# Patient Record
Sex: Female | Born: 1940
Health system: Southern US, Community
[De-identification: ages and names within clinical notes are randomized; demographics above are authoritative.]

## PROBLEM LIST (undated history)

## (undated) DIAGNOSIS — E785 Hyperlipidemia, unspecified: Secondary | ICD-10-CM

## (undated) DIAGNOSIS — D689 Coagulation defect, unspecified: Secondary | ICD-10-CM

## (undated) DIAGNOSIS — I4819 Other persistent atrial fibrillation: Secondary | ICD-10-CM

## (undated) DIAGNOSIS — Z8619 Personal history of other infectious and parasitic diseases: Secondary | ICD-10-CM

## (undated) DIAGNOSIS — J449 Chronic obstructive pulmonary disease, unspecified: Secondary | ICD-10-CM

## (undated) DIAGNOSIS — T7840XA Allergy, unspecified, initial encounter: Secondary | ICD-10-CM

## (undated) DIAGNOSIS — J45909 Unspecified asthma, uncomplicated: Secondary | ICD-10-CM

## (undated) DIAGNOSIS — Z7901 Long term (current) use of anticoagulants: Secondary | ICD-10-CM

## (undated) DIAGNOSIS — M1990 Unspecified osteoarthritis, unspecified site: Secondary | ICD-10-CM

## (undated) DIAGNOSIS — H269 Unspecified cataract: Secondary | ICD-10-CM

## (undated) DIAGNOSIS — I110 Hypertensive heart disease with heart failure: Secondary | ICD-10-CM

## (undated) DIAGNOSIS — I509 Heart failure, unspecified: Secondary | ICD-10-CM

## (undated) DIAGNOSIS — I10 Essential (primary) hypertension: Secondary | ICD-10-CM

## (undated) HISTORY — DX: Long term (current) use of anticoagulants: Z79.01

## (undated) HISTORY — PX: ABDOMINAL HYSTERECTOMY: SHX81

## (undated) HISTORY — PX: CHOLECYSTECTOMY: SHX55

## (undated) HISTORY — PX: APPENDECTOMY: SHX54

## (undated) HISTORY — DX: Heart failure, unspecified: I50.9

## (undated) HISTORY — PX: COLONOSCOPY: SHX174

## (undated) HISTORY — DX: Hyperlipidemia, unspecified: E78.5

## (undated) HISTORY — DX: Unspecified osteoarthritis, unspecified site: M19.90

## (undated) HISTORY — DX: Essential (primary) hypertension: I10

## (undated) HISTORY — DX: Unspecified asthma, uncomplicated: J45.909

## (undated) HISTORY — DX: Other persistent atrial fibrillation: I48.19

## (undated) HISTORY — DX: Allergy, unspecified, initial encounter: T78.40XA

## (undated) HISTORY — DX: Personal history of other infectious and parasitic diseases: Z86.19

## (undated) HISTORY — DX: Coagulation defect, unspecified: D68.9

## (undated) HISTORY — PX: TONSILLECTOMY: SUR1361

## (undated) HISTORY — DX: Chronic obstructive pulmonary disease, unspecified: J44.9

## (undated) HISTORY — DX: Unspecified cataract: H26.9

## (undated) HISTORY — DX: Hypertensive heart disease with heart failure: I11.0

---

## 2015-03-09 DIAGNOSIS — J209 Acute bronchitis, unspecified: Secondary | ICD-10-CM | POA: Diagnosis not present

## 2015-03-12 ENCOUNTER — Ambulatory Visit: Payer: Self-pay | Admitting: Allergy and Immunology

## 2015-03-12 DIAGNOSIS — J0100 Acute maxillary sinusitis, unspecified: Secondary | ICD-10-CM | POA: Diagnosis not present

## 2015-03-12 DIAGNOSIS — J209 Acute bronchitis, unspecified: Secondary | ICD-10-CM | POA: Diagnosis not present

## 2015-04-14 DIAGNOSIS — R05 Cough: Secondary | ICD-10-CM | POA: Diagnosis not present

## 2015-04-14 DIAGNOSIS — R0602 Shortness of breath: Secondary | ICD-10-CM | POA: Diagnosis not present

## 2015-04-14 DIAGNOSIS — J209 Acute bronchitis, unspecified: Secondary | ICD-10-CM | POA: Diagnosis not present

## 2015-05-05 DIAGNOSIS — J209 Acute bronchitis, unspecified: Secondary | ICD-10-CM | POA: Diagnosis not present

## 2015-05-05 DIAGNOSIS — J4521 Mild intermittent asthma with (acute) exacerbation: Secondary | ICD-10-CM | POA: Diagnosis not present

## 2015-05-14 DIAGNOSIS — R413 Other amnesia: Secondary | ICD-10-CM | POA: Diagnosis not present

## 2015-06-05 DIAGNOSIS — R413 Other amnesia: Secondary | ICD-10-CM | POA: Diagnosis not present

## 2015-06-05 DIAGNOSIS — R42 Dizziness and giddiness: Secondary | ICD-10-CM | POA: Diagnosis not present

## 2015-06-05 DIAGNOSIS — I129 Hypertensive chronic kidney disease with stage 1 through stage 4 chronic kidney disease, or unspecified chronic kidney disease: Secondary | ICD-10-CM | POA: Diagnosis not present

## 2015-06-05 DIAGNOSIS — N183 Chronic kidney disease, stage 3 (moderate): Secondary | ICD-10-CM | POA: Diagnosis not present

## 2015-06-10 DIAGNOSIS — R42 Dizziness and giddiness: Secondary | ICD-10-CM | POA: Diagnosis not present

## 2015-06-10 DIAGNOSIS — R413 Other amnesia: Secondary | ICD-10-CM | POA: Diagnosis not present

## 2015-06-11 DIAGNOSIS — R413 Other amnesia: Secondary | ICD-10-CM | POA: Diagnosis not present

## 2015-06-11 DIAGNOSIS — Z0000 Encounter for general adult medical examination without abnormal findings: Secondary | ICD-10-CM | POA: Diagnosis not present

## 2015-06-11 DIAGNOSIS — Z13820 Encounter for screening for osteoporosis: Secondary | ICD-10-CM | POA: Diagnosis not present

## 2015-06-11 DIAGNOSIS — I129 Hypertensive chronic kidney disease with stage 1 through stage 4 chronic kidney disease, or unspecified chronic kidney disease: Secondary | ICD-10-CM | POA: Diagnosis not present

## 2015-06-11 DIAGNOSIS — N183 Chronic kidney disease, stage 3 (moderate): Secondary | ICD-10-CM | POA: Diagnosis not present

## 2015-06-11 DIAGNOSIS — E559 Vitamin D deficiency, unspecified: Secondary | ICD-10-CM | POA: Diagnosis not present

## 2015-06-11 DIAGNOSIS — Z780 Asymptomatic menopausal state: Secondary | ICD-10-CM | POA: Diagnosis not present

## 2015-06-11 DIAGNOSIS — Z9181 History of falling: Secondary | ICD-10-CM | POA: Diagnosis not present

## 2015-06-11 DIAGNOSIS — Z1231 Encounter for screening mammogram for malignant neoplasm of breast: Secondary | ICD-10-CM | POA: Diagnosis not present

## 2015-06-11 DIAGNOSIS — Z1389 Encounter for screening for other disorder: Secondary | ICD-10-CM | POA: Diagnosis not present

## 2015-06-19 DIAGNOSIS — Z1231 Encounter for screening mammogram for malignant neoplasm of breast: Secondary | ICD-10-CM | POA: Diagnosis not present

## 2015-06-19 DIAGNOSIS — M85851 Other specified disorders of bone density and structure, right thigh: Secondary | ICD-10-CM | POA: Diagnosis not present

## 2015-06-19 DIAGNOSIS — Z780 Asymptomatic menopausal state: Secondary | ICD-10-CM | POA: Diagnosis not present

## 2015-06-19 DIAGNOSIS — M8580 Other specified disorders of bone density and structure, unspecified site: Secondary | ICD-10-CM | POA: Diagnosis not present

## 2015-07-10 DIAGNOSIS — Z6839 Body mass index (BMI) 39.0-39.9, adult: Secondary | ICD-10-CM | POA: Diagnosis not present

## 2015-07-10 DIAGNOSIS — I10 Essential (primary) hypertension: Secondary | ICD-10-CM | POA: Diagnosis not present

## 2015-07-10 DIAGNOSIS — R69 Illness, unspecified: Secondary | ICD-10-CM | POA: Diagnosis not present

## 2015-07-10 DIAGNOSIS — I5030 Unspecified diastolic (congestive) heart failure: Secondary | ICD-10-CM | POA: Diagnosis not present

## 2015-07-10 DIAGNOSIS — R42 Dizziness and giddiness: Secondary | ICD-10-CM | POA: Diagnosis not present

## 2015-07-10 DIAGNOSIS — R413 Other amnesia: Secondary | ICD-10-CM | POA: Diagnosis not present

## 2015-07-10 DIAGNOSIS — E7800 Pure hypercholesterolemia, unspecified: Secondary | ICD-10-CM | POA: Diagnosis not present

## 2015-07-10 DIAGNOSIS — E669 Obesity, unspecified: Secondary | ICD-10-CM | POA: Diagnosis not present

## 2015-07-10 DIAGNOSIS — I4892 Unspecified atrial flutter: Secondary | ICD-10-CM | POA: Diagnosis not present

## 2015-07-10 DIAGNOSIS — I481 Persistent atrial fibrillation: Secondary | ICD-10-CM | POA: Diagnosis not present

## 2015-07-10 DIAGNOSIS — I200 Unstable angina: Secondary | ICD-10-CM | POA: Diagnosis not present

## 2015-07-10 DIAGNOSIS — I161 Hypertensive emergency: Secondary | ICD-10-CM | POA: Diagnosis not present

## 2015-07-10 DIAGNOSIS — E785 Hyperlipidemia, unspecified: Secondary | ICD-10-CM | POA: Diagnosis not present

## 2015-07-10 DIAGNOSIS — I110 Hypertensive heart disease with heart failure: Secondary | ICD-10-CM | POA: Diagnosis not present

## 2015-07-10 DIAGNOSIS — R079 Chest pain, unspecified: Secondary | ICD-10-CM | POA: Diagnosis not present

## 2015-07-10 DIAGNOSIS — J45909 Unspecified asthma, uncomplicated: Secondary | ICD-10-CM | POA: Diagnosis not present

## 2015-07-10 DIAGNOSIS — I4891 Unspecified atrial fibrillation: Secondary | ICD-10-CM | POA: Diagnosis not present

## 2015-07-11 DIAGNOSIS — R079 Chest pain, unspecified: Secondary | ICD-10-CM | POA: Diagnosis not present

## 2015-07-11 DIAGNOSIS — I481 Persistent atrial fibrillation: Secondary | ICD-10-CM | POA: Diagnosis not present

## 2015-07-11 DIAGNOSIS — I4891 Unspecified atrial fibrillation: Secondary | ICD-10-CM | POA: Diagnosis not present

## 2015-07-11 DIAGNOSIS — I110 Hypertensive heart disease with heart failure: Secondary | ICD-10-CM | POA: Diagnosis not present

## 2015-07-11 DIAGNOSIS — E785 Hyperlipidemia, unspecified: Secondary | ICD-10-CM | POA: Diagnosis not present

## 2015-07-12 DIAGNOSIS — E785 Hyperlipidemia, unspecified: Secondary | ICD-10-CM | POA: Diagnosis not present

## 2015-07-12 DIAGNOSIS — I481 Persistent atrial fibrillation: Secondary | ICD-10-CM | POA: Diagnosis not present

## 2015-07-12 DIAGNOSIS — I110 Hypertensive heart disease with heart failure: Secondary | ICD-10-CM | POA: Diagnosis not present

## 2015-07-15 DIAGNOSIS — I129 Hypertensive chronic kidney disease with stage 1 through stage 4 chronic kidney disease, or unspecified chronic kidney disease: Secondary | ICD-10-CM | POA: Diagnosis not present

## 2015-07-15 DIAGNOSIS — I4892 Unspecified atrial flutter: Secondary | ICD-10-CM | POA: Diagnosis not present

## 2015-07-15 DIAGNOSIS — I4891 Unspecified atrial fibrillation: Secondary | ICD-10-CM | POA: Diagnosis not present

## 2015-07-15 DIAGNOSIS — N183 Chronic kidney disease, stage 3 (moderate): Secondary | ICD-10-CM | POA: Diagnosis not present

## 2015-07-15 DIAGNOSIS — Z9189 Other specified personal risk factors, not elsewhere classified: Secondary | ICD-10-CM | POA: Diagnosis not present

## 2015-08-14 DIAGNOSIS — N183 Chronic kidney disease, stage 3 (moderate): Secondary | ICD-10-CM | POA: Diagnosis not present

## 2015-08-14 DIAGNOSIS — I4892 Unspecified atrial flutter: Secondary | ICD-10-CM | POA: Diagnosis not present

## 2015-08-14 DIAGNOSIS — I129 Hypertensive chronic kidney disease with stage 1 through stage 4 chronic kidney disease, or unspecified chronic kidney disease: Secondary | ICD-10-CM | POA: Diagnosis not present

## 2015-08-14 DIAGNOSIS — R413 Other amnesia: Secondary | ICD-10-CM | POA: Diagnosis not present

## 2015-08-14 DIAGNOSIS — I4891 Unspecified atrial fibrillation: Secondary | ICD-10-CM | POA: Diagnosis not present

## 2015-09-09 DIAGNOSIS — I110 Hypertensive heart disease with heart failure: Secondary | ICD-10-CM | POA: Insufficient documentation

## 2015-09-09 DIAGNOSIS — I4819 Other persistent atrial fibrillation: Secondary | ICD-10-CM

## 2015-09-09 DIAGNOSIS — Z7901 Long term (current) use of anticoagulants: Secondary | ICD-10-CM | POA: Insufficient documentation

## 2015-09-09 HISTORY — DX: Other persistent atrial fibrillation: I48.19

## 2015-09-09 HISTORY — DX: Hypertensive heart disease with heart failure: I11.0

## 2015-09-09 HISTORY — DX: Long term (current) use of anticoagulants: Z79.01

## 2015-09-10 DIAGNOSIS — I110 Hypertensive heart disease with heart failure: Secondary | ICD-10-CM | POA: Diagnosis not present

## 2015-09-10 DIAGNOSIS — Z7901 Long term (current) use of anticoagulants: Secondary | ICD-10-CM | POA: Diagnosis not present

## 2015-09-10 DIAGNOSIS — I481 Persistent atrial fibrillation: Secondary | ICD-10-CM | POA: Diagnosis not present

## 2015-10-13 DIAGNOSIS — R1033 Periumbilical pain: Secondary | ICD-10-CM | POA: Diagnosis not present

## 2015-10-13 DIAGNOSIS — R109 Unspecified abdominal pain: Secondary | ICD-10-CM | POA: Diagnosis not present

## 2015-10-13 DIAGNOSIS — R05 Cough: Secondary | ICD-10-CM | POA: Diagnosis not present

## 2015-10-13 DIAGNOSIS — R1013 Epigastric pain: Secondary | ICD-10-CM | POA: Diagnosis not present

## 2015-10-13 DIAGNOSIS — R0602 Shortness of breath: Secondary | ICD-10-CM | POA: Diagnosis not present

## 2015-10-16 DIAGNOSIS — R42 Dizziness and giddiness: Secondary | ICD-10-CM | POA: Diagnosis not present

## 2015-10-16 DIAGNOSIS — J449 Chronic obstructive pulmonary disease, unspecified: Secondary | ICD-10-CM | POA: Diagnosis not present

## 2015-10-16 DIAGNOSIS — I130 Hypertensive heart and chronic kidney disease with heart failure and stage 1 through stage 4 chronic kidney disease, or unspecified chronic kidney disease: Secondary | ICD-10-CM | POA: Diagnosis not present

## 2015-10-16 DIAGNOSIS — I5030 Unspecified diastolic (congestive) heart failure: Secondary | ICD-10-CM | POA: Diagnosis not present

## 2015-10-16 DIAGNOSIS — N183 Chronic kidney disease, stage 3 (moderate): Secondary | ICD-10-CM | POA: Diagnosis not present

## 2015-10-16 DIAGNOSIS — Z79899 Other long term (current) drug therapy: Secondary | ICD-10-CM | POA: Diagnosis not present

## 2015-10-16 DIAGNOSIS — R109 Unspecified abdominal pain: Secondary | ICD-10-CM | POA: Diagnosis not present

## 2015-11-17 DIAGNOSIS — Z23 Encounter for immunization: Secondary | ICD-10-CM | POA: Diagnosis not present

## 2015-12-10 DIAGNOSIS — I481 Persistent atrial fibrillation: Secondary | ICD-10-CM | POA: Diagnosis not present

## 2015-12-10 DIAGNOSIS — Z7901 Long term (current) use of anticoagulants: Secondary | ICD-10-CM | POA: Diagnosis not present

## 2015-12-10 DIAGNOSIS — I110 Hypertensive heart disease with heart failure: Secondary | ICD-10-CM | POA: Diagnosis not present

## 2015-12-31 DIAGNOSIS — Z6836 Body mass index (BMI) 36.0-36.9, adult: Secondary | ICD-10-CM | POA: Diagnosis not present

## 2015-12-31 DIAGNOSIS — E559 Vitamin D deficiency, unspecified: Secondary | ICD-10-CM | POA: Diagnosis not present

## 2015-12-31 DIAGNOSIS — Z0000 Encounter for general adult medical examination without abnormal findings: Secondary | ICD-10-CM | POA: Diagnosis not present

## 2015-12-31 DIAGNOSIS — Z9181 History of falling: Secondary | ICD-10-CM | POA: Diagnosis not present

## 2015-12-31 DIAGNOSIS — Z136 Encounter for screening for cardiovascular disorders: Secondary | ICD-10-CM | POA: Diagnosis not present

## 2015-12-31 DIAGNOSIS — I130 Hypertensive heart and chronic kidney disease with heart failure and stage 1 through stage 4 chronic kidney disease, or unspecified chronic kidney disease: Secondary | ICD-10-CM | POA: Diagnosis not present

## 2015-12-31 DIAGNOSIS — E7800 Pure hypercholesterolemia, unspecified: Secondary | ICD-10-CM | POA: Diagnosis not present

## 2016-01-26 DIAGNOSIS — Z79899 Other long term (current) drug therapy: Secondary | ICD-10-CM | POA: Diagnosis not present

## 2016-01-26 DIAGNOSIS — N183 Chronic kidney disease, stage 3 (moderate): Secondary | ICD-10-CM | POA: Diagnosis not present

## 2016-01-26 DIAGNOSIS — I4892 Unspecified atrial flutter: Secondary | ICD-10-CM | POA: Diagnosis not present

## 2016-01-26 DIAGNOSIS — I5030 Unspecified diastolic (congestive) heart failure: Secondary | ICD-10-CM | POA: Diagnosis not present

## 2016-01-26 DIAGNOSIS — I4891 Unspecified atrial fibrillation: Secondary | ICD-10-CM | POA: Diagnosis not present

## 2016-01-26 DIAGNOSIS — R413 Other amnesia: Secondary | ICD-10-CM | POA: Diagnosis not present

## 2016-01-26 DIAGNOSIS — I130 Hypertensive heart and chronic kidney disease with heart failure and stage 1 through stage 4 chronic kidney disease, or unspecified chronic kidney disease: Secondary | ICD-10-CM | POA: Diagnosis not present

## 2016-01-28 DIAGNOSIS — Z1211 Encounter for screening for malignant neoplasm of colon: Secondary | ICD-10-CM | POA: Diagnosis not present

## 2016-05-27 DIAGNOSIS — H524 Presbyopia: Secondary | ICD-10-CM | POA: Diagnosis not present

## 2016-05-27 DIAGNOSIS — Z0100 Encounter for examination of eyes and vision without abnormal findings: Secondary | ICD-10-CM | POA: Diagnosis not present

## 2016-06-16 DIAGNOSIS — E7800 Pure hypercholesterolemia, unspecified: Secondary | ICD-10-CM | POA: Diagnosis not present

## 2016-06-16 DIAGNOSIS — E669 Obesity, unspecified: Secondary | ICD-10-CM | POA: Diagnosis not present

## 2016-06-16 DIAGNOSIS — Z0000 Encounter for general adult medical examination without abnormal findings: Secondary | ICD-10-CM | POA: Diagnosis not present

## 2016-06-16 DIAGNOSIS — R8290 Unspecified abnormal findings in urine: Secondary | ICD-10-CM | POA: Diagnosis not present

## 2016-06-16 DIAGNOSIS — I5030 Unspecified diastolic (congestive) heart failure: Secondary | ICD-10-CM | POA: Diagnosis not present

## 2016-06-16 DIAGNOSIS — E559 Vitamin D deficiency, unspecified: Secondary | ICD-10-CM | POA: Diagnosis not present

## 2016-06-16 DIAGNOSIS — Z131 Encounter for screening for diabetes mellitus: Secondary | ICD-10-CM | POA: Diagnosis not present

## 2016-06-16 DIAGNOSIS — Z9181 History of falling: Secondary | ICD-10-CM | POA: Diagnosis not present

## 2016-06-16 DIAGNOSIS — N183 Chronic kidney disease, stage 3 (moderate): Secondary | ICD-10-CM | POA: Diagnosis not present

## 2016-06-16 DIAGNOSIS — Z1211 Encounter for screening for malignant neoplasm of colon: Secondary | ICD-10-CM | POA: Diagnosis not present

## 2016-06-16 DIAGNOSIS — I130 Hypertensive heart and chronic kidney disease with heart failure and stage 1 through stage 4 chronic kidney disease, or unspecified chronic kidney disease: Secondary | ICD-10-CM | POA: Diagnosis not present

## 2016-06-16 DIAGNOSIS — Z1389 Encounter for screening for other disorder: Secondary | ICD-10-CM | POA: Diagnosis not present

## 2016-06-20 DIAGNOSIS — Z1231 Encounter for screening mammogram for malignant neoplasm of breast: Secondary | ICD-10-CM | POA: Diagnosis not present

## 2016-06-23 DIAGNOSIS — I481 Persistent atrial fibrillation: Secondary | ICD-10-CM | POA: Diagnosis not present

## 2016-06-23 DIAGNOSIS — R55 Syncope and collapse: Secondary | ICD-10-CM | POA: Diagnosis not present

## 2016-06-23 DIAGNOSIS — Z87898 Personal history of other specified conditions: Secondary | ICD-10-CM | POA: Diagnosis not present

## 2016-06-23 DIAGNOSIS — J449 Chronic obstructive pulmonary disease, unspecified: Secondary | ICD-10-CM | POA: Diagnosis not present

## 2016-06-23 DIAGNOSIS — R413 Other amnesia: Secondary | ICD-10-CM | POA: Diagnosis not present

## 2016-06-23 DIAGNOSIS — E7800 Pure hypercholesterolemia, unspecified: Secondary | ICD-10-CM | POA: Diagnosis not present

## 2016-06-23 DIAGNOSIS — Z7901 Long term (current) use of anticoagulants: Secondary | ICD-10-CM | POA: Diagnosis not present

## 2016-06-23 DIAGNOSIS — N183 Chronic kidney disease, stage 3 (moderate): Secondary | ICD-10-CM | POA: Diagnosis not present

## 2016-06-23 DIAGNOSIS — I4891 Unspecified atrial fibrillation: Secondary | ICD-10-CM | POA: Diagnosis not present

## 2016-06-23 DIAGNOSIS — E559 Vitamin D deficiency, unspecified: Secondary | ICD-10-CM | POA: Diagnosis not present

## 2016-06-23 DIAGNOSIS — I5030 Unspecified diastolic (congestive) heart failure: Secondary | ICD-10-CM | POA: Diagnosis not present

## 2016-06-23 DIAGNOSIS — I4892 Unspecified atrial flutter: Secondary | ICD-10-CM | POA: Diagnosis not present

## 2016-06-23 DIAGNOSIS — I130 Hypertensive heart and chronic kidney disease with heart failure and stage 1 through stage 4 chronic kidney disease, or unspecified chronic kidney disease: Secondary | ICD-10-CM | POA: Diagnosis not present

## 2016-06-23 DIAGNOSIS — I110 Hypertensive heart disease with heart failure: Secondary | ICD-10-CM | POA: Diagnosis not present

## 2016-06-28 DIAGNOSIS — I480 Paroxysmal atrial fibrillation: Secondary | ICD-10-CM | POA: Diagnosis not present

## 2016-06-30 DIAGNOSIS — N390 Urinary tract infection, site not specified: Secondary | ICD-10-CM | POA: Diagnosis not present

## 2016-06-30 DIAGNOSIS — I481 Persistent atrial fibrillation: Secondary | ICD-10-CM | POA: Diagnosis not present

## 2016-06-30 DIAGNOSIS — R55 Syncope and collapse: Secondary | ICD-10-CM | POA: Diagnosis not present

## 2016-07-01 DIAGNOSIS — I481 Persistent atrial fibrillation: Secondary | ICD-10-CM | POA: Diagnosis not present

## 2016-07-21 DIAGNOSIS — I481 Persistent atrial fibrillation: Secondary | ICD-10-CM | POA: Diagnosis not present

## 2016-07-21 DIAGNOSIS — I110 Hypertensive heart disease with heart failure: Secondary | ICD-10-CM | POA: Diagnosis not present

## 2016-07-21 DIAGNOSIS — Z7901 Long term (current) use of anticoagulants: Secondary | ICD-10-CM | POA: Diagnosis not present

## 2016-07-21 DIAGNOSIS — I480 Paroxysmal atrial fibrillation: Secondary | ICD-10-CM | POA: Diagnosis not present

## 2016-11-03 DIAGNOSIS — E785 Hyperlipidemia, unspecified: Secondary | ICD-10-CM

## 2016-11-03 HISTORY — DX: Hyperlipidemia, unspecified: E78.5

## 2016-11-16 DIAGNOSIS — I5032 Chronic diastolic (congestive) heart failure: Secondary | ICD-10-CM | POA: Insufficient documentation

## 2016-11-16 NOTE — Progress Notes (Signed)
Cardiology Office Note:    Date:  11/18/2016   ID:  Robin Salazar, DOB 27-Mar-1940, MRN [id number]  PCP:  Doreene Eland, MD  Cardiologist:  Norman Herrlich, MD    Referring MD: No ref. provider found    ASSESSMENT:    1. Persistent atrial fibrillation (HCC)   2. Chronic diastolic heart failure (HCC)   3. Hypertensive heart disease with heart failure (HCC)   4. Chronic anticoagulation   5. Chest pain in adult    PLAN:    In order of problems listed above:  1. Stable rate controlled continue beta blocker and current anticoagulant 2. Stable compensated continue her current diuretic sodium restriction 3. Stable continue diuretic beta blocker and ARB 4. Stable continue her anticoagulant 5. At risk for CAD undergo myocardial perfusion imaging.   Next appointment: 6 months   Medication Adjustments/Labs and Tests Ordered: Current medicines are reviewed at length with the patient today.  Concerns regarding medicines are outlined above.  Orders Placed This Encounter  Procedures  . MYOCARDIAL PERFUSION IMAGING  . EKG 12-Lead  . Electrocardiogram report   No orders of the defined types were placed in this encounter.   Chief Complaint  Patient presents with  . Follow-up    History of Present Illness:    Robin Salazar is a 76 y.o. female with a hx of Diastolic CHF, persitent Atrial Fibrillation, HTN, Hyperlipidemia  last seen in May 2018.she hand carries an EKG performed at another practice in June showing typical atrial flutter and rapid rate at which time her beta blocker was increased. Since then she has felt well and is had no shortness of breath palpitation syncope TIA or bleeding complication of her anticoagulant. She points to her right costochondral junction and tells me she gets intermittent brief sharp nonexertional chest pain which is reproducible by physical examination. It is been greater than 3-5 years since an ischemic evaluation and she'll undergo myocardial  perfusion study. Compliance with diet, lifestyle and medications: yes Past Medical History:  Diagnosis Date  . Chronic anticoagulation 09/09/2015  . Hyperlipidemia 11/03/2016  . Hypertensive heart disease with heart failure (HCC) 09/09/2015  . Persistent atrial fibrillation (HCC) 09/09/2015    Past Surgical History:  Procedure Laterality Date  . ABDOMINAL HYSTERECTOMY    . APPENDECTOMY    . CHOLECYSTECTOMY    . TONSILLECTOMY      Current Medications: Current Meds  Medication Sig  . apixaban (ELIQUIS) 5 MG TABS tablet Take 5 mg by mouth 2 (two) times daily.  Marland Kitchen CALCIUM PO Take 1 tablet by mouth daily.  . DOCOSAHEXAENOIC ACID PO Take 2 g by mouth daily.  Marland Kitchen donepezil (ARICEPT) 10 MG tablet Take 10 mg by mouth daily.  . Fluticasone-Salmeterol (ADVAIR DISKUS) 250-50 MCG/DOSE AEPB Inhale 1 puff into the lungs 2 (two) times daily.  . furosemide (LASIX) 20 MG tablet Take 40 mg by mouth daily.  Marland Kitchen KLOR-CON M20 20 MEQ tablet Take 20 mEq by mouth 2 (two) times daily.  . metoprolol tartrate (LOPRESSOR) 25 MG tablet Take 25 mg by mouth. 2 tablets in the Am and 1 tablet in the pm  . Multiple Vitamin (MULTI-VITAMINS) TABS Take 1 tablet by mouth daily.  . pantoprazole (PROTONIX) 40 MG tablet Take 40 mg by mouth daily.  . simvastatin (ZOCOR) 40 MG tablet Take 40 mg by mouth daily.  Marland Kitchen telmisartan (MICARDIS) 80 MG tablet Take 80 mg by mouth daily.  . Vitamin D, Ergocalciferol, (DRISDOL) 50000 units CAPS capsule Take 1  capsule by mouth once a week.     Allergies:   Patient has no known allergies.   Social History   Social History  . Marital status: Married    Spouse name: N/A  . Number of children: N/A  . Years of education: N/A   Social History Main Topics  . Smoking status: Never Smoker  . Smokeless tobacco: Never Used  . Alcohol use No  . Drug use: No  . Sexual activity: Not Asked   Other Topics Concern  . None   Social History Narrative  . None     Family History: The patient's  family history includes Diabetes in her father; Heart disease in her father and mother. ROS:   Please see the history of present illness.    All other systems reviewed and are negative.  EKGs/Labs/Other Studies Reviewed:    The following studies were reviewed today:  EKG:  EKG ordered today.  The ekg ordered today demonstrates AF and a controlled rate. Her EKG in June showed typical flutter with a rapid rate.  Recent Labs: requested from her PCP   Physical Exam:    VS:  BP 114/70   Pulse 63   Resp (!) 94   Ht  (1.575 m)   Wt 189 lb (85.7 kg)   BMI 34.57 kg/m     Wt Readings from Last 3 Encounters:  11/18/16 189 lb (85.7 kg)     GEN:  Well nourished, well developed in no acute distress HEENT: Normal NECK: No JVD; No carotid bruits LYMPHATICS: No lymphadenopathy CARDIAC: irregular irregular variable first heart sound no murmurs, rubs, gallops there is point tenderness along the right and left CCJ consistent with costochondral pain RESPIRATORY:  Clear to auscultation without rales, wheezing or rhonchi  ABDOMEN: Soft, non-tender, non-distended MUSCULOSKELETAL:  No edema; No deformity  SKIN: Warm and dry NEUROLOGIC:  Alert and oriented x 3 PSYCHIATRIC:  Normal affect    Signed, Norman Herrlich, MD  11/18/2016 11:52 AM    [facility]

## 2016-11-18 ENCOUNTER — Ambulatory Visit (INDEPENDENT_AMBULATORY_CARE_PROVIDER_SITE_OTHER): Payer: Medicare HMO | Admitting: Cardiology

## 2016-11-18 ENCOUNTER — Encounter: Payer: Self-pay | Admitting: Cardiology

## 2016-11-18 VITALS — BP 114/70 | HR 63 | Resp 94 | Ht 62.0 in | Wt 189.0 lb

## 2016-11-18 DIAGNOSIS — I110 Hypertensive heart disease with heart failure: Secondary | ICD-10-CM

## 2016-11-18 DIAGNOSIS — I5032 Chronic diastolic (congestive) heart failure: Secondary | ICD-10-CM

## 2016-11-18 DIAGNOSIS — R079 Chest pain, unspecified: Secondary | ICD-10-CM

## 2016-11-18 DIAGNOSIS — Z7901 Long term (current) use of anticoagulants: Secondary | ICD-10-CM

## 2016-11-18 DIAGNOSIS — I481 Persistent atrial fibrillation: Secondary | ICD-10-CM

## 2016-11-18 DIAGNOSIS — I4819 Other persistent atrial fibrillation: Secondary | ICD-10-CM

## 2016-11-18 NOTE — Patient Instructions (Signed)
Medication Instructions:  Your physician recommends that you continue on your current medications as directed. Please refer to the Current Medication list given to you today.   Labwork: NONE   Testing/Procedures: You had an EKG today  Your physician has requested that you have a lexiscan myoview. For further information please visit [email or web address].org. Please follow instruction sheet, as given.    Follow-Up: Your physician wants you to follow-up in: 6 months.  You will receive a reminder letter in the mail two months in advance. If you don't receive a letter, please call our office to schedule the follow-up appointment.   Any Other Special Instructions Will Be Listed Below (If Applicable).     If you need a refill on your cardiac medications before your next appointment, please call your pharmacy.   

## 2016-11-25 ENCOUNTER — Ambulatory Visit (HOSPITAL_COMMUNITY): Payer: Medicare HMO | Attending: Internal Medicine

## 2016-11-25 DIAGNOSIS — I481 Persistent atrial fibrillation: Secondary | ICD-10-CM | POA: Diagnosis not present

## 2016-11-25 DIAGNOSIS — I4819 Other persistent atrial fibrillation: Secondary | ICD-10-CM

## 2016-11-25 LAB — MYOCARDIAL PERFUSION IMAGING
CHL CUP NUCLEAR SSS: 3
CHL CUP STRESS STAGE 1 HR: 63 {beats}/min
CHL CUP STRESS STAGE 1 SBP: 139 mmHg
CHL CUP STRESS STAGE 1 SPEED: 0 mph
CHL CUP STRESS STAGE 2 GRADE: 0 %
CHL CUP STRESS STAGE 2 SPEED: 0 mph
CHL CUP STRESS STAGE 3 DBP: 87 mmHg
CHL CUP STRESS STAGE 3 HR: 105 {beats}/min
CHL CUP STRESS STAGE 3 SBP: 140 mmHg
CHL CUP STRESS STAGE 3 SPEED: 0 mph
CHL CUP STRESS STAGE 4 DBP: 79 mmHg
CHL CUP STRESS STAGE 4 HR: 84 {beats}/min
CSEPPBP: 140 mmHg
CSEPPHR: 105 {beats}/min
CSEPPMHR: 72 %
Estimated workload: 1 METS
RATE: 0.37
Rest HR: 70 {beats}/min
SDS: 2
SRS: 1
Stage 1 DBP: 88 mmHg
Stage 1 Grade: 0 %
Stage 2 HR: 62 {beats}/min
Stage 3 Grade: 0 %
Stage 4 Grade: 0 %
Stage 4 SBP: 119 mmHg
Stage 4 Speed: 0 mph
TID: 0.98

## 2016-11-25 MED ORDER — TECHNETIUM TC 99M TETROFOSMIN IV KIT
31.6000 | PACK | Freq: Once | INTRAVENOUS | Status: AC | PRN
  Administered 2016-11-25: 31.6 via INTRAVENOUS
  Filled 2016-11-25: qty 32

## 2016-11-25 MED ORDER — TECHNETIUM TC 99M TETROFOSMIN IV KIT
10.3000 | PACK | Freq: Once | INTRAVENOUS | Status: AC | PRN
  Administered 2016-11-25: 10.3 via INTRAVENOUS
  Filled 2016-11-25: qty 11

## 2016-11-25 MED ORDER — REGADENOSON 0.4 MG/5ML IV SOLN
0.4000 mg | Freq: Once | INTRAVENOUS | Status: AC
  Administered 2016-11-25: 0.4 mg via INTRAVENOUS

## 2016-12-05 DIAGNOSIS — Z23 Encounter for immunization: Secondary | ICD-10-CM | POA: Diagnosis not present

## 2017-01-12 DIAGNOSIS — E669 Obesity, unspecified: Secondary | ICD-10-CM | POA: Diagnosis not present

## 2017-01-12 DIAGNOSIS — Z79899 Other long term (current) drug therapy: Secondary | ICD-10-CM | POA: Diagnosis not present

## 2017-01-12 DIAGNOSIS — Z1389 Encounter for screening for other disorder: Secondary | ICD-10-CM | POA: Diagnosis not present

## 2017-01-12 DIAGNOSIS — E785 Hyperlipidemia, unspecified: Secondary | ICD-10-CM | POA: Diagnosis not present

## 2017-01-12 DIAGNOSIS — Z6837 Body mass index (BMI) 37.0-37.9, adult: Secondary | ICD-10-CM | POA: Diagnosis not present

## 2017-01-12 DIAGNOSIS — Z0000 Encounter for general adult medical examination without abnormal findings: Secondary | ICD-10-CM | POA: Diagnosis not present

## 2017-01-12 DIAGNOSIS — Z136 Encounter for screening for cardiovascular disorders: Secondary | ICD-10-CM | POA: Diagnosis not present

## 2017-01-12 DIAGNOSIS — I130 Hypertensive heart and chronic kidney disease with heart failure and stage 1 through stage 4 chronic kidney disease, or unspecified chronic kidney disease: Secondary | ICD-10-CM | POA: Diagnosis not present

## 2017-02-17 DIAGNOSIS — N183 Chronic kidney disease, stage 3 (moderate): Secondary | ICD-10-CM | POA: Diagnosis not present

## 2017-02-17 DIAGNOSIS — I4892 Unspecified atrial flutter: Secondary | ICD-10-CM | POA: Diagnosis not present

## 2017-02-17 DIAGNOSIS — E7800 Pure hypercholesterolemia, unspecified: Secondary | ICD-10-CM | POA: Diagnosis not present

## 2017-02-17 DIAGNOSIS — I5030 Unspecified diastolic (congestive) heart failure: Secondary | ICD-10-CM | POA: Diagnosis not present

## 2017-02-17 DIAGNOSIS — I130 Hypertensive heart and chronic kidney disease with heart failure and stage 1 through stage 4 chronic kidney disease, or unspecified chronic kidney disease: Secondary | ICD-10-CM | POA: Diagnosis not present

## 2017-02-17 DIAGNOSIS — E559 Vitamin D deficiency, unspecified: Secondary | ICD-10-CM | POA: Diagnosis not present

## 2017-02-17 DIAGNOSIS — I4891 Unspecified atrial fibrillation: Secondary | ICD-10-CM | POA: Diagnosis not present

## 2017-02-17 DIAGNOSIS — J449 Chronic obstructive pulmonary disease, unspecified: Secondary | ICD-10-CM | POA: Diagnosis not present

## 2017-03-23 DIAGNOSIS — S63502A Unspecified sprain of left wrist, initial encounter: Secondary | ICD-10-CM | POA: Diagnosis not present

## 2017-03-23 DIAGNOSIS — S8002XA Contusion of left knee, initial encounter: Secondary | ICD-10-CM | POA: Diagnosis not present

## 2017-03-31 DIAGNOSIS — J988 Other specified respiratory disorders: Secondary | ICD-10-CM | POA: Diagnosis not present

## 2017-03-31 DIAGNOSIS — J22 Unspecified acute lower respiratory infection: Secondary | ICD-10-CM | POA: Diagnosis not present

## 2017-03-31 DIAGNOSIS — R05 Cough: Secondary | ICD-10-CM | POA: Diagnosis not present

## 2017-03-31 DIAGNOSIS — I130 Hypertensive heart and chronic kidney disease with heart failure and stage 1 through stage 4 chronic kidney disease, or unspecified chronic kidney disease: Secondary | ICD-10-CM | POA: Diagnosis not present

## 2017-05-19 ENCOUNTER — Ambulatory Visit (INDEPENDENT_AMBULATORY_CARE_PROVIDER_SITE_OTHER): Payer: Medicare HMO | Admitting: Cardiology

## 2017-05-19 ENCOUNTER — Encounter: Payer: Self-pay | Admitting: Cardiology

## 2017-05-19 VITALS — BP 134/80 | HR 78 | Ht 62.0 in | Wt 197.4 lb

## 2017-05-19 DIAGNOSIS — E785 Hyperlipidemia, unspecified: Secondary | ICD-10-CM

## 2017-05-19 DIAGNOSIS — Z7901 Long term (current) use of anticoagulants: Secondary | ICD-10-CM

## 2017-05-19 DIAGNOSIS — I481 Persistent atrial fibrillation: Secondary | ICD-10-CM | POA: Diagnosis not present

## 2017-05-19 DIAGNOSIS — I5032 Chronic diastolic (congestive) heart failure: Secondary | ICD-10-CM

## 2017-05-19 DIAGNOSIS — I4819 Other persistent atrial fibrillation: Secondary | ICD-10-CM

## 2017-05-19 DIAGNOSIS — I110 Hypertensive heart disease with heart failure: Secondary | ICD-10-CM

## 2017-05-19 NOTE — Patient Instructions (Signed)

## 2017-05-19 NOTE — Progress Notes (Signed)
Cardiology Office Note:    Date:  05/19/2017   ID:  Robin Salazar, DOB [birth year], MRN [id number]  PCP:  Paulina Fusi, MD  Cardiologist:  Norman Herrlich, MD    Referring MD: Doreene Eland, MD please do labs at her office visit including CMP lipid profile BNP and a CBC with anticoagulant   ASSESSMENT:    1. Chronic diastolic heart failure (HCC)   2. Hypertensive heart disease with heart failure (HCC)   3. Persistent atrial fibrillation (HCC)   4. Chronic anticoagulation   5. Hyperlipidemia, unspecified hyperlipidemia type    PLAN:    In order of problems listed above: Stable 1. Stable continue current diuretic sodium restriction home self management assisted by her daughter.  She will have labs at her PCPs office for renal function loss also asked for a BnP level. 2. Stable blood pressure at target continue current treatment ARB beta-blocker 3. Stable rate controlled continue low-dose beta-blocker and current anticoagulant she has had no bleeding complication 4. Stable continue her anticoagulant, she does not require dose adjustment with Eliquis.  For renal function. 5. Stable continue statin check CMP for safety lipid profile for efficacy.   Next appointment: 6 months   Medication Adjustments/Labs and Tests Ordered: Current medicines are reviewed at length with the patient today.  Concerns regarding medicines are outlined above.  No orders of the defined types were placed in this encounter.  No orders of the defined types were placed in this encounter.   Chief Complaint  Patient presents with  . Follow-up    6 month flup appt   . Congestive Heart Failure  . Atrial Fibrillation  . Hypertension    History of Present Illness:    Robin Salazar is a 77 y.o. female with a hx of Diastolic CHF, persitent Atrial Fibrillation/atypical atrial flutter, HTN, Hyperlipidemia last seen 11/18/17.  ASSESSMENT:    11/18/17   1. Persistent atrial fibrillation (HCC)   2.  Chronic diastolic heart failure (HCC)   3. Hypertensive heart disease with heart failure (HCC)   4. Chronic anticoagulation   5. Chest pain in adult    PLAN:    In order of problems listed above:  1. Stable rate controlled continue beta blocker and current anticoagulant 2. Stable compensated continue her current diuretic sodium restriction 3. Stable continue diuretic beta blocker and ARB 4. Stable continue her anticoagulant 5. At risk for CAD undergo myocardial perfusion imaging.    Compliance with diet, lifestyle and medications: Yes but she is not weighing herself and recording her weight. She is supervised by her daughter overall is doing well but still had salt to her diet at times and is not recording her weight but is stable at home.  She is using a pillbox and her daughter counts medications.  She is pleased with the quality of her life lives independently has had no chest pain shortness of breath orthopnea palpitations edema syncope or TIA she is reassured with a normal myocardial perfusion study Past Medical History:  Diagnosis Date  . Chronic anticoagulation 09/09/2015  . Hyperlipidemia 11/03/2016  . Hypertensive heart disease with heart failure (HCC) 09/09/2015  . Persistent atrial fibrillation (HCC) 09/09/2015    Past Surgical History:  Procedure Laterality Date  . ABDOMINAL HYSTERECTOMY    . APPENDECTOMY    . CHOLECYSTECTOMY    . TONSILLECTOMY      Current Medications: Current Meds  Medication Sig  . ANORO ELLIPTA 62.5-25 MCG/INH AEPB TAKE 1 PUFF BY MOUTH  EVERY DAY  . apixaban (ELIQUIS) 5 MG TABS tablet Take 5 mg by mouth 2 (two) times daily.  Marland Kitchen CALCIUM PO Take 1 tablet by mouth daily.  . Chlorpheniramine Maleate (ALLERGY RELIEF PO) Take 1 tablet by mouth daily.  . DOCOSAHEXAENOIC ACID PO Take 2 g by mouth daily.  Marland Kitchen donepezil (ARICEPT) 10 MG tablet Take 10 mg by mouth daily.  . furosemide (LASIX) 20 MG tablet Take 40 mg by mouth daily.  Marland Kitchen KLOR-CON M20 20 MEQ  tablet Take 20 mEq by mouth 2 (two) times daily.  . metoprolol tartrate (LOPRESSOR) 25 MG tablet Take 25 mg by mouth. 2 tablets in the Am and 1 tablet in the pm  . Multiple Vitamin (MULTI-VITAMINS) TABS Take 1 tablet by mouth daily.  . pantoprazole (PROTONIX) 40 MG tablet Take 40 mg by mouth daily.  . simvastatin (ZOCOR) 40 MG tablet Take 40 mg by mouth daily.  Marland Kitchen telmisartan (MICARDIS) 80 MG tablet Take 80 mg by mouth daily.  . Vitamin D, Ergocalciferol, (DRISDOL) 50000 units CAPS capsule Take 1 capsule by mouth once a week.     Allergies:   Patient has no known allergies.   Social History   Socioeconomic History  . Marital status: Married    Spouse name: Not on file  . Number of children: Not on file  . Years of education: Not on file  . Highest education level: Not on file  Occupational History  . Not on file  Social Needs  . Financial resource strain: Not on file  . Food insecurity:    Worry: Not on file    Inability: Not on file  . Transportation needs:    Medical: Not on file    Non-medical: Not on file  Tobacco Use  . Smoking status: Never Smoker  . Smokeless tobacco: Never Used  Substance and Sexual Activity  . Alcohol use: No  . Drug use: No  . Sexual activity: Not on file  Lifestyle  . Physical activity:    Days per week: Not on file    Minutes per session: Not on file  . Stress: Not on file  Relationships  . Social connections:    Talks on phone: Not on file    Gets together: Not on file    Attends religious service: Not on file    Active member of club or organization: Not on file    Attends meetings of clubs or organizations: Not on file    Relationship status: Not on file  Other Topics Concern  . Not on file  Social History Narrative  . Not on file     Family History: The patient's family history includes Diabetes in her father; Heart disease in her father and mother. ROS:   Please see the history of present illness.    All other systems  reviewed and are negative.  EKGs/Labs/Other Studies Reviewed:    The following studies were reviewed today:   MPI 11/25/16: Study Highlights   There was no ST segment deviation noted during stress.  Defect 1: There is a small defect of mild severity.  The study is normal.  This is a low risk study. Low risk stress nuclear study with a small apical thinning artifact, otherwise normal perfusion. Not gated due to atrial fibrillation.    Recent Labs: No results found for requested labs within last 8760 hours.  Recent Lipid Panel No results found for: CHOL, TRIG, HDL, CHOLHDL, VLDL, LDLCALC, LDLDIRECT  Physical Exam:  VS:  BP 134/80 (BP Location: Right Arm, Patient Position: Sitting, Cuff Size: Large)   Pulse 78   Ht 5\' 2"  (1.575 m)   Wt 197 lb 6.4 oz (89.5 kg)   SpO2 96%   BMI 36.10 kg/m     Wt Readings from Last 3 Encounters:  05/19/17 197 lb 6.4 oz (89.5 kg)  11/18/16 189 lb (85.7 kg)     GEN:  Well nourished, well developed in no acute distress HEENT: Normal NECK: No JVD; No carotid bruits LYMPHATICS: No lymphadenopathy CARDIAC: RRR, no murmurs, rubs, gallops RESPIRATORY:  Clear to auscultation without rales, wheezing or rhonchi  ABDOMEN: Soft, non-tender, non-distended MUSCULOSKELETAL:  No edema; No deformity  SKIN: Warm and dry NEUROLOGIC:  Alert and oriented x 3 PSYCHIATRIC:  Normal affect    Signed, Norman HerrlichBrian Munley, MD  05/19/2017 10:31 AM    [facility]

## 2017-05-24 ENCOUNTER — Other Ambulatory Visit: Payer: Self-pay

## 2017-05-24 MED ORDER — FUROSEMIDE 20 MG PO TABS: 40.0000 mg | ORAL_TABLET | Freq: Every day | ORAL | 3 refills | Status: DC

## 2017-07-05 DIAGNOSIS — H524 Presbyopia: Secondary | ICD-10-CM | POA: Diagnosis not present

## 2017-07-14 DIAGNOSIS — E559 Vitamin D deficiency, unspecified: Secondary | ICD-10-CM | POA: Diagnosis not present

## 2017-07-14 DIAGNOSIS — I130 Hypertensive heart and chronic kidney disease with heart failure and stage 1 through stage 4 chronic kidney disease, or unspecified chronic kidney disease: Secondary | ICD-10-CM | POA: Diagnosis not present

## 2017-07-14 DIAGNOSIS — Z1231 Encounter for screening mammogram for malignant neoplasm of breast: Secondary | ICD-10-CM | POA: Diagnosis not present

## 2017-07-14 DIAGNOSIS — Z1211 Encounter for screening for malignant neoplasm of colon: Secondary | ICD-10-CM | POA: Diagnosis not present

## 2017-07-14 DIAGNOSIS — Z79899 Other long term (current) drug therapy: Secondary | ICD-10-CM | POA: Diagnosis not present

## 2017-07-14 DIAGNOSIS — Z0000 Encounter for general adult medical examination without abnormal findings: Secondary | ICD-10-CM | POA: Diagnosis not present

## 2017-07-14 DIAGNOSIS — E785 Hyperlipidemia, unspecified: Secondary | ICD-10-CM | POA: Diagnosis not present

## 2017-08-11 DIAGNOSIS — Z1231 Encounter for screening mammogram for malignant neoplasm of breast: Secondary | ICD-10-CM | POA: Diagnosis not present

## 2017-09-15 DIAGNOSIS — R69 Illness, unspecified: Secondary | ICD-10-CM | POA: Diagnosis not present

## 2017-09-15 DIAGNOSIS — K5730 Diverticulosis of large intestine without perforation or abscess without bleeding: Secondary | ICD-10-CM | POA: Diagnosis not present

## 2017-09-15 DIAGNOSIS — E782 Mixed hyperlipidemia: Secondary | ICD-10-CM | POA: Diagnosis not present

## 2017-09-15 DIAGNOSIS — Z6837 Body mass index (BMI) 37.0-37.9, adult: Secondary | ICD-10-CM | POA: Diagnosis not present

## 2017-09-15 DIAGNOSIS — R1013 Epigastric pain: Secondary | ICD-10-CM | POA: Diagnosis not present

## 2017-09-15 DIAGNOSIS — R109 Unspecified abdominal pain: Secondary | ICD-10-CM | POA: Diagnosis not present

## 2017-09-15 DIAGNOSIS — I10 Essential (primary) hypertension: Secondary | ICD-10-CM | POA: Diagnosis not present

## 2017-09-18 ENCOUNTER — Telehealth: Payer: Self-pay | Admitting: Gastroenterology

## 2017-09-18 NOTE — Telephone Encounter (Signed)
Pt's daughter is returning your call  #[phone]

## 2017-09-28 ENCOUNTER — Encounter (INDEPENDENT_AMBULATORY_CARE_PROVIDER_SITE_OTHER): Payer: Self-pay

## 2017-09-28 ENCOUNTER — Telehealth: Payer: Self-pay

## 2017-09-28 ENCOUNTER — Encounter: Payer: Self-pay | Admitting: Gastroenterology

## 2017-09-28 ENCOUNTER — Ambulatory Visit (INDEPENDENT_AMBULATORY_CARE_PROVIDER_SITE_OTHER): Payer: Medicare HMO | Admitting: Gastroenterology

## 2017-09-28 VITALS — BP 116/64 | HR 70 | Ht 62.0 in | Wt 196.1 lb

## 2017-09-28 DIAGNOSIS — R1013 Epigastric pain: Secondary | ICD-10-CM | POA: Diagnosis not present

## 2017-09-28 DIAGNOSIS — Z7901 Long term (current) use of anticoagulants: Secondary | ICD-10-CM

## 2017-09-28 NOTE — Progress Notes (Signed)
09/28/2017 Robin Salazar [id number] 1940-05-13   HISTORY OF PRESENT ILLNESS: This is a 77 year old female who is a patient of Dr. Urban GibsonGupta's.  She presents here today with complaints of epigastric abdominal pain.  She is with her daughter today who helps with most of the history.  The patient has some dementia and finds it difficult to remember and describe things.  Anyway, her daughter reports that her mother has been complaining of epigastric abdominal pain.  It was quite severe a couple of weeks ago and [facility].  There she had a CT scan of the abdomen and pelvis with contrast that showed left colon diverticular disease without acute inflammatory changes, but no other acute issues were noted.  CBC, LFT's, and lipase were normal.  She has been on pantoprazole 40 mg daily for quite some time, which she was given in the past for similar complaints.  The daughter says that they have been to the emergency department on another occasion sometime back when she was complaining of pain in the similar area.  Anyway, on this particular ED visit recently they discontinued her pantoprazole and placed her on Zantac 150 mg daily and Carafate tablets 4 times daily.  They told her to follow-up with GI as she would probably need an endoscopy to rule out ulcer, etc.  Never had EGD in the past.  She reports some nausea, but no vomiting.  She denies heartburn/reflux/indigestion.  She could not characterize the pain.  She had a colonoscopy in December 2007 at which time she was found to have one small polyp that was removed and also left-sided colonic diverticulosis.  The pathology showed unremarkable colonic mucosa.  She then had a colonoscopy in May 2013 at which time she was found to have mild pancolonic diverticulosis and small internal hemorrhoids.  Random colon biopsies were performed and showed benign colonic mucosa with lymphoid aggregate and no features suggestive of  microscopic colitis.  She is on Eliquis for the past few years for atrial fibrillation that is prescribed by her cardiologist, Dr. Dulce SellarMunley.  Past Medical History:  Diagnosis Date  . Chronic anticoagulation 09/09/2015  . Hyperlipidemia 11/03/2016  . Hypertensive heart disease with heart failure (HCC) 09/09/2015  . Persistent atrial fibrillation (HCC) 09/09/2015   Past Surgical History:  Procedure Laterality Date  . ABDOMINAL HYSTERECTOMY    . APPENDECTOMY    . CHOLECYSTECTOMY    . TONSILLECTOMY      reports that she has never smoked. She has never used smokeless tobacco. She reports that she does not drink alcohol or use drugs. family history includes Diabetes in her father; Heart disease in her father and mother. Allergies  Allergen Reactions  . Contrast Media [Iodinated Diagnostic Agents]       Outpatient Encounter Medications as of 09/28/2017  Medication Sig  . ANORO ELLIPTA 62.5-25 MCG/INH AEPB TAKE 1 PUFF BY MOUTH EVERY DAY  . apixaban (ELIQUIS) 5 MG TABS tablet Take 5 mg by mouth 2 (two) times daily.  Marland Kitchen. CALCIUM PO Take 1 tablet by mouth daily.  . Chlorpheniramine Maleate (ALLERGY RELIEF PO) Take 1 tablet by mouth daily.  . DOCOSAHEXAENOIC ACID PO Take 2 g by mouth daily.  Marland Kitchen. donepezil (ARICEPT) 10 MG tablet Take 10 mg by mouth daily.  . furosemide (LASIX) 20 MG tablet Take 2 tablets (40 mg total) by mouth daily.  Marland Kitchen. KLOR-CON M20 20 MEQ tablet Take 20 mEq by mouth 2 (two) times  daily.  . metoprolol tartrate (LOPRESSOR) 25 MG tablet Take 25 mg by mouth. 2 tablets in the Am and 1 tablet in the pm  . Multiple Vitamin (MULTI-VITAMINS) TABS Take 1 tablet by mouth daily.  . ranitidine (ZANTAC) 150 MG capsule Take 150 mg by mouth daily.  . simvastatin (ZOCOR) 40 MG tablet Take 40 mg by mouth daily.  . sucralfate (CARAFATE) 1 g tablet Take 1 g by mouth 4 (four) times daily -  with meals and at bedtime.  Marland Kitchen. telmisartan (MICARDIS) 80 MG tablet Take 80 mg by mouth daily.  . Vitamin D,  Ergocalciferol, (DRISDOL) 50000 units CAPS capsule Take 1 capsule by mouth once a week.  . [DISCONTINUED] pantoprazole (PROTONIX) 40 MG tablet Take 40 mg by mouth daily.   No facility-administered encounter medications on file as of 09/28/2017.      REVIEW OF SYSTEMS  : All other systems reviewed and negative except where noted in the History of Present Illness.   PHYSICAL EXAM: BP 116/64   Pulse 70   Ht 5\' 2"  (1.575 m)   Wt 196 lb 2 oz (89 kg)   BMI 35.87 kg/m  General: Well developed white female in no acute distress Head: Normocephalic and atraumatic Eyes:  Sclerae anicteric, conjunctiva pink. Ears: Normal auditory acuity Lungs: Clear throughout to auscultation; no increased WOB. Heart: Regular rate and rhythm; no M/R/G. Abdomen: Soft, non-distended.  BS present.  Moderate epigastric TTP but then she also expressed tenderness diffusely. Musculoskeletal: Symmetrical with no gross deformities  Skin: No lesions on visible extremities Extremities: No edema  Neurological: Alert oriented x 4, grossly non-focal; pleasantly confused. Psychological:  Alert and cooperative. Normal mood and affect  ASSESSMENT AND PLAN: *Epigastric abdominal pain:  Has been present for a couple of weeks but apparently had complained about it in the past as well.  Non-contrast CT scan ok.  Improved somewhat since her ED visit, but still present.  Only other symptom is mild nausea.  Will schedule for EGD to rule out ulcer, esophagitis, etc.  This will be scheduled with Dr. Barron Alvineirigliano as Dr. Chales AbrahamsGupta does not have any appointments in the near future. *Chronic anticoagulation with Eliquis due to atrial fibrillation:  Will hold Xarelto for  days prior to endoscopic procedures - will instruct when and how to resume after procedure. Benefits and risks of procedure explained including risks of bleeding, perforation, infection, missed lesions, reactions to medications and possible need for hospitalization and surgery for  complications. Additional rare but real risk of stroke or other vascular clotting events off of Eliquis also explained and need to seek urgent help if any signs of these problems occur. Will communicate by phone or EMR with patient's prescribing provider, Dr. Dulce SellarMunley, to confirm that holding Eliquis is reasonable in this case.    CC:  Paulina FusiSchultz, Douglas E, MD

## 2017-09-28 NOTE — Telephone Encounter (Signed)
  09/28/2017   RE: Robin GalasMary Pressly DOB: December 25, 1940 MRN: [id number]   Dear Dr. Dulce SellarMunley,   We have scheduled the above patient for an endoscopic procedure. Our records show that she is on anticoagulation therapy.   Please advise as to how long the patient may come off her therapy of Eliquis prior to the procedure, which is scheduled for 10/20/17.  Please fax back/ or route the completed form to [facility] at [phone].   Sincerely,   Morrie SheldonAshley, LPN

## 2017-09-28 NOTE — Patient Instructions (Signed)
If you are age 77 or older, your body mass index should be between 23-30. Your Body mass index is 35.87 kg/m. If this is out of the aforementioned range listed, please consider follow up with your Primary Care Provider.  If you are age 77 or younger, your body mass index should be between 19-25. Your Body mass index is 35.87 kg/m. If this is out of the aformentioned range listed, please consider follow up with your Primary Care Provider.   You have been scheduled for an endoscopy and colonoscopy. Please follow the written instructions given to you at your visit today. Please pick up your prep supplies at the pharmacy within the next 1-3 days. If you use inhalers (even only as needed), please bring them with you on the day of your procedure. Your physician has requested that you go to www.startemmi.com and enter the access code given to you at your visit today. This web site gives a general overview about your procedure. However, you should still follow specific instructions given to you by our office regarding your preparation for the procedure.  You will be contacted by our office prior to your procedure for directions on holding your Eliquis.  If you do not hear from our office 1 week prior to your scheduled procedure, please call [phone] to discuss.   Thank you for choosing me and [facility].   Doug SouJessica Zehr, PA-C

## 2017-10-02 ENCOUNTER — Telehealth: Payer: Self-pay

## 2017-10-02 NOTE — Telephone Encounter (Signed)
I called and spoke with Misty StanleyLisa, patients daughter regarding per Dr. Dulce SellarMunley instructions to hold Eiliquis 2 days prior to procedure.  The daughter verbalized understanding and states that she will make sure that her mom follows these instructions.  She states her mother would not understand if I spoke directly with her.

## 2017-10-02 NOTE — Telephone Encounter (Signed)
-----   Message from Dorene SorrowAshley N Hill, LPN sent at 1/61/09608/19/2019 10:21 AM EDT -----   ----- Message ----- From: Baldo DaubMunley, Brian J, MD Sent: 09/30/2017  10:30 AM EDT To: Dorene SorrowAshley N Hill, LPN  A good reference for anticoagulation management  [email or web address]  2 days =4 doses of hold on apixaban , please tell her when to resume usually 24-48 hrs after procedure or longer if high bleeding risk

## 2017-10-03 DIAGNOSIS — H18413 Arcus senilis, bilateral: Secondary | ICD-10-CM | POA: Diagnosis not present

## 2017-10-03 DIAGNOSIS — H2512 Age-related nuclear cataract, left eye: Secondary | ICD-10-CM | POA: Diagnosis not present

## 2017-10-03 DIAGNOSIS — H25043 Posterior subcapsular polar age-related cataract, bilateral: Secondary | ICD-10-CM | POA: Diagnosis not present

## 2017-10-03 DIAGNOSIS — H02831 Dermatochalasis of right upper eyelid: Secondary | ICD-10-CM | POA: Diagnosis not present

## 2017-10-03 DIAGNOSIS — H25013 Cortical age-related cataract, bilateral: Secondary | ICD-10-CM | POA: Diagnosis not present

## 2017-10-03 DIAGNOSIS — H2513 Age-related nuclear cataract, bilateral: Secondary | ICD-10-CM | POA: Diagnosis not present

## 2017-10-04 ENCOUNTER — Encounter: Payer: Self-pay | Admitting: Gastroenterology

## 2017-10-18 ENCOUNTER — Ambulatory Visit (AMBULATORY_SURGERY_CENTER): Payer: Medicare HMO | Admitting: Gastroenterology

## 2017-10-18 ENCOUNTER — Encounter: Payer: Self-pay | Admitting: Gastroenterology

## 2017-10-18 VITALS — BP 163/70 | HR 86 | Temp 97.5°F | Resp 17 | Ht 62.0 in | Wt 196.0 lb

## 2017-10-18 DIAGNOSIS — I509 Heart failure, unspecified: Secondary | ICD-10-CM | POA: Diagnosis not present

## 2017-10-18 DIAGNOSIS — K2990 Gastroduodenitis, unspecified, without bleeding: Secondary | ICD-10-CM | POA: Diagnosis not present

## 2017-10-18 DIAGNOSIS — R1013 Epigastric pain: Secondary | ICD-10-CM

## 2017-10-18 DIAGNOSIS — K2970 Gastritis, unspecified, without bleeding: Secondary | ICD-10-CM

## 2017-10-18 DIAGNOSIS — B9681 Helicobacter pylori [H. pylori] as the cause of diseases classified elsewhere: Secondary | ICD-10-CM | POA: Diagnosis not present

## 2017-10-18 DIAGNOSIS — I4891 Unspecified atrial fibrillation: Secondary | ICD-10-CM | POA: Diagnosis not present

## 2017-10-18 DIAGNOSIS — K2950 Unspecified chronic gastritis without bleeding: Secondary | ICD-10-CM | POA: Diagnosis not present

## 2017-10-18 MED ORDER — SODIUM CHLORIDE 0.9 % IV SOLN: 500.0000 mL | Freq: Once | INTRAVENOUS | Status: DC

## 2017-10-18 NOTE — Op Note (Signed)
[facility] Patient Name: Robin Salazar Procedure Date: 10/18/2017 9:17 AM MRN: [id number] Endoscopist: Doristine Locks , MD Age: 77 Referring MD:  Date of Birth: November 30, 1940 Gender: Female Account #: [id number] Procedure:                Upper GI endoscopy Indications:              Epigastric abdominal pain Medicines:                Monitored Anesthesia Care Procedure:                Pre-Anesthesia Assessment:                           - Prior to the procedure, a History and Physical                            was performed, and patient medications and                            allergies were reviewed. The patient's tolerance of                            previous anesthesia was also reviewed. The risks                            and benefits of the procedure and the sedation                            options and risks were discussed with the patient.                            All questions were answered, and informed consent                            was obtained. Prior Anticoagulants: The patient has                            taken Eliquis (apixaban), last dose was 3 days                            prior to procedure. ASA Grade Assessment: III - A                            patient with severe systemic disease. After                            reviewing the risks and benefits, the patient was                            deemed in satisfactory condition to undergo the                            procedure.  After obtaining informed consent, the endoscope was                            passed under direct vision. Throughout the                            procedure, the patient's blood pressure, pulse, and                            oxygen saturations were monitored continuously. The                            Model GIF-HQ190 [id number]) scope was introduced                            through the mouth, and advanced to the third part               of duodenum. The upper GI endoscopy was                            accomplished without difficulty. The patient                            tolerated the procedure well. Scope In: Scope Out: Findings:                 The examined esophagus was normal.                           Esophagogastric landmarks were identified: the                            Z-line was found at 40 cm, the gastroesophageal                            junction was found at 40 cm and the site of hiatal                            narrowing was found at 40 cm from the incisors.                           A few, 2 mm non-bleeding erosions with surrounding                            mucosal erythema were found in the gastric antrum.                            There were no stigmata of recent bleeding. Biopsies                            were taken with a cold forceps for Helicobacter                            pylori testing. Estimated blood loss was minimal.  The cardia, gastric fundus and gastric body were                            normal. Biopsies were taken with a cold forceps for                            Helicobacter pylori testing. Estimated blood loss                            was minimal.                           Localized mildly erythematous mucosa without active                            bleeding and with no stigmata of bleeding was found                            in the duodenal bulb. Biopsies were taken with a                            cold forceps for histology. Estimated blood loss                            was minimal.                           The second portion of the duodenum and third                            portion of the duodenum were normal. Complications:            No immediate complications. Estimated Blood Loss:     Estimated blood loss was minimal. Impression:               - Normal esophagus.                           - Esophagogastric landmarks  identified.                           - Non-bleeding erosive gastropathy in the gastric                            antrum. Biopsied.                           - Normal cardia, gastric fundus and gastric body.                            Biopsied.                           - Erythematous duodenopathy in the duodenal bulb.  Biopsied.                           - Normal second portion of the duodenum and third                            portion of the duodenum. Recommendation:           - Patient has a contact number available for                            emergencies. The signs and symptoms of potential                            delayed complications were discussed with the                            patient. Return to normal activities tomorrow.                            Written discharge instructions were provided to the                            patient.                           - Resume previous diet today.                           - Continue present medications.                           - Resume Eliquis (apixaban) at prior dose in 1 day.                           - Await pathology results.                           - Use Prilosec (omeprazole) 20 mg PO BID for 6                            weeks to promote mucosal healing. Then can reduce                            to 20 mg daily for gastric protection. Doristine Locks, MD 10/18/2017 9:34:43 AM

## 2017-10-18 NOTE — Progress Notes (Signed)
Called to room to assist during endoscopic procedure.  Patient ID and intended procedure confirmed with present staff. Received instructions for my participation in the procedure from the performing physician.  

## 2017-10-18 NOTE — Patient Instructions (Addendum)
YOU HAD AN ENDOSCOPIC PROCEDURE TODAY AT THE [facility]:   Refer to the procedure report that was given to you for any specific questions about what was found during the examination.  If the procedure report does not answer your questions, please call your gastroenterologist to clarify.  If you requested that your care partner not be given the details of your procedure findings, then the procedure report has been included in a sealed envelope for you to review at your convenience later.  YOU SHOULD EXPECT: Some feelings of bloating in the abdomen. Passage of more gas than usual.  Walking can help get rid of the air that was put into your GI tract during the procedure and reduce the bloating. If you had a lower endoscopy (such as a colonoscopy or flexible sigmoidoscopy) you may notice spotting of blood in your stool or on the toilet paper. If you underwent a bowel prep for your procedure, you may not have a normal bowel movement for a few days.  Please Note:  You might notice some irritation and congestion in your nose or some drainage.  This is from the oxygen used during your procedure.  There is no need for concern and it should clear up in a day or so.  SYMPTOMS TO REPORT IMMEDIATELY:    Following upper endoscopy (EGD)  Vomiting of blood or coffee ground material  New chest pain or pain under the shoulder blades  Painful or persistently difficult swallowing  New shortness of breath  Fever of 100F or higher  Black, tarry-looking stools  For urgent or emergent issues, a gastroenterologist can be reached at any hour by calling [phone].   DIET:  We do recommend a small meal at first, but then you may proceed to your regular diet.  Drink plenty of fluids but you should avoid alcoholic beverages for 24 hours.  ACTIVITY:  You should plan to take it easy for the rest of today and you should NOT DRIVE or use heavy machinery until tomorrow (because of the sedation medicines used  during the test).    FOLLOW UP: Our staff will call the number listed on your records the next business day following your procedure to check on you and address any questions or concerns that you may have regarding the information given to you following your procedure. If we do not reach you, we will leave a message.  However, if you are feeling well and you are not experiencing any problems, there is no need to return our call.  We will assume that you have returned to your regular daily activities without incident.    If any biopsies were taken you will be contacted by phone or by letter within the next 1-3 weeks.  Please call us at [phone] if you have not heard about the biopsies in 3 weeks.    SIGNATURES/CONFIDENTIALITY: You and/or your care partner have signed paperwork which will be entered into your electronic medical record.  These signatures attest to the fact that that the information above on your After Visit Summary has been reviewed and is understood.  Full responsibility of the confidentiality of this discharge information lies with you and/or your care-partner.  Prilosec 20mg . (omeprazole) by mouth twice daily for 6 weeks to promote mucosal healing.  Then reduce to 20mg . One time daily for gastric protection.  Resume Eliquis tomorrow at prior dose.

## 2017-10-18 NOTE — Progress Notes (Signed)
Report given to PACU, vss 

## 2017-10-19 ENCOUNTER — Telehealth: Payer: Self-pay

## 2017-10-19 NOTE — Telephone Encounter (Signed)
  Follow up Call-  Call back number 10/18/2017  Post procedure Call Back phone  # #[phone] cell  Permission to leave phone message Yes  Some recent data might be hidden     Patient questions:  Do you have a fever, pain , or abdominal swelling? No. Pain Score  0 *  Have you tolerated food without any problems? Yes.    Have you been able to return to your normal activities? Yes.    Do you have any questions about your discharge instructions: Diet   No. Medications  No. Follow up visit  No.  Do you have questions or concerns about your Care? No.  Actions: * If pain score is 4 or above: No action needed, pain <4.

## 2017-10-20 ENCOUNTER — Other Ambulatory Visit: Payer: Self-pay

## 2017-10-20 DIAGNOSIS — A048 Other specified bacterial intestinal infections: Secondary | ICD-10-CM

## 2017-10-20 MED ORDER — BISMUTH SUBSALICYLATE 262 MG PO CHEW: 524.0000 mg | CHEWABLE_TABLET | Freq: Four times a day (QID) | ORAL | 0 refills | Status: AC

## 2017-10-20 MED ORDER — OMEPRAZOLE 20 MG PO CPDR: 20.0000 mg | DELAYED_RELEASE_CAPSULE | Freq: Two times a day (BID) | ORAL | 0 refills | Status: DC

## 2017-10-20 MED ORDER — METRONIDAZOLE 250 MG PO TABS: 250.0000 mg | ORAL_TABLET | Freq: Four times a day (QID) | ORAL | 0 refills | Status: AC

## 2017-10-20 MED ORDER — DOXYCYCLINE HYCLATE 100 MG PO CAPS: 100.0000 mg | ORAL_CAPSULE | Freq: Two times a day (BID) | ORAL | 0 refills | Status: AC

## 2017-11-14 NOTE — Progress Notes (Signed)
Cardiology Office Note:    Date:  11/15/2017   ID:  Robin Salazar, DOB Sep 08, 1940, MRN [id number]  PCP:  Paulina Fusi, MD  Cardiologist:  Norman Herrlich, MD    Referring MD: Paulina Fusi, MD    ASSESSMENT:    1. Persistent atrial fibrillation   2. Chronic anticoagulation   3. Chronic diastolic heart failure (HCC)   4. Hypertensive heart disease with heart failure (HCC)   5. Hyperlipidemia, unspecified hyperlipidemia type    PLAN:    In order of problems listed above:  1. She is asymptomatic rate is controlled on anticoagulants and will continue current treatment the beta-blocker Eliquis and I would not advise attempts to resume sinus rhythm. 2. Stable she is had no bleeding complication continue her anticoagulant 3. Stable nicely compensated she has no edema New York Heart Association class I continue her sodium restriction and very low-dose loop diuretic 4. Stable blood pressure target continue current treatment including her ARB 5. Stable continue her statin   Next appointment: 6 months   Medication Adjustments/Labs and Tests Ordered: Current medicines are reviewed at length with the patient today.  Concerns regarding medicines are outlined above.  Orders Placed This Encounter  Procedures  . EKG 12-Lead   No orders of the defined types were placed in this encounter.   Chief Complaint  Patient presents with  . Follow-up  . Atrial Fibrillation  . Congestive Heart Failure    History of Present Illness:    Robin Salazar is a 77 y.o. female with a hx of Diastolic CHF, persitent Atrial Fibrillation/atypical atrial flutter, HTN, Hyperlipidemia  last seen 05/19/2017. Compliance with diet, lifestyle and medications: yes  She is done well pleased with the quality of her life has no limitations in her activities and has had no chest pain edema orthopnea dyspnea palpitations syncope TIA or bleeding complication of her current anticoagulant Past Medical History:    Diagnosis Date  . Allergy   . Arthritis   . Asthma    anoro - inhaler  . Cataract    left eye  . CHF (congestive heart failure) (HCC)   . Chronic anticoagulation 09/09/2015  . Clotting disorder (HCC)    anticoagulant - a fib  . COPD (chronic obstructive pulmonary disease) (HCC)   . Hyperlipidemia 11/03/2016  . Hypertension   . Hypertensive heart disease with heart failure (HCC) 09/09/2015  . Persistent atrial fibrillation 09/09/2015    Past Surgical History:  Procedure Laterality Date  . ABDOMINAL HYSTERECTOMY    . APPENDECTOMY    . CHOLECYSTECTOMY    . COLONOSCOPY    . TONSILLECTOMY      Current Medications: Current Meds  Medication Sig  . ANORO ELLIPTA 62.5-25 MCG/INH AEPB TAKE 1 PUFF BY MOUTH EVERY DAY  . apixaban (ELIQUIS) 5 MG TABS tablet Take 5 mg by mouth 2 (two) times daily.  Marland Kitchen CALCIUM PO Take 1 tablet by mouth daily.  . Chlorpheniramine Maleate (ALLERGY RELIEF PO) Take 1 tablet by mouth daily.  . DOCOSAHEXAENOIC ACID PO Take 2 g by mouth daily.  Marland Kitchen donepezil (ARICEPT) 10 MG tablet Take 10 mg by mouth daily.  . furosemide (LASIX) 20 MG tablet Take 2 tablets (40 mg total) by mouth daily.  Marland Kitchen KLOR-CON M20 20 MEQ tablet Take 20 mEq by mouth 2 (two) times daily.  . metoprolol tartrate (LOPRESSOR) 25 MG tablet Take 25 mg by mouth. 2 tablets in the Am and 1 tablet in the pm  . Multiple Vitamin (  MULTI-VITAMINS) TABS Take 1 tablet by mouth daily.  Marland Kitchen omeprazole (PRILOSEC) 20 MG capsule Take 1 capsule (20 mg total) by mouth 2 (two) times daily for 14 days. (Patient taking differently: Take 20 mg by mouth daily as needed. )  . simvastatin (ZOCOR) 40 MG tablet Take 40 mg by mouth daily.  Marland Kitchen telmisartan (MICARDIS) 80 MG tablet Take 80 mg by mouth daily.  . Vitamin D, Ergocalciferol, (DRISDOL) 50000 units CAPS capsule Take 1 capsule by mouth once a week.   Current Facility-Administered Medications for the 11/15/17 encounter (Office Visit) with Baldo Daub, MD  Medication  . 0.9  %  sodium chloride infusion     Allergies:   Contrast media [iodinated diagnostic agents]   Social History   Socioeconomic History  . Marital status: Married    Spouse name: Not on file  . Number of children: Not on file  . Years of education: Not on file  . Highest education level: Not on file  Occupational History  . Not on file  Social Needs  . Financial resource strain: Not on file  . Food insecurity:    Worry: Not on file    Inability: Not on file  . Transportation needs:    Medical: Not on file    Non-medical: Not on file  Tobacco Use  . Smoking status: Never Smoker  . Smokeless tobacco: Never Used  Substance and Sexual Activity  . Alcohol use: No  . Drug use: No  . Sexual activity: Not Currently  Lifestyle  . Physical activity:    Days per week: Not on file    Minutes per session: Not on file  . Stress: Not on file  Relationships  . Social connections:    Talks on phone: Not on file    Gets together: Not on file    Attends religious service: Not on file    Active member of club or organization: Not on file    Attends meetings of clubs or organizations: Not on file    Relationship status: Not on file  Other Topics Concern  . Not on file  Social History Narrative  . Not on file     Family History: The patient's family history includes Diabetes in her father; Heart disease in her father and mother. There is no history of Colon cancer, Esophageal cancer, Rectal cancer, or Stomach cancer. ROS:   Please see the history of present illness.    All other systems reviewed and are negative.  EKGs/Labs/Other Studies Reviewed:    The following studies were reviewed today:  EKG:  EKG ordered today.  The ekg ordered today demonstrates rate controlled atrial fibrillation  Recent Labs:   07/14/2017 cholesterol 149 LDL 69 A1c 5.0 creatinine 1.61 TSH was normal liver function normal No results found for requested labs within last 8760 hours.  Recent Lipid Panel No  results found for: CHOL, TRIG, HDL, CHOLHDL, VLDL, LDLCALC, LDLDIRECT  Physical Exam:    VS:  BP 138/60 (BP Location: Right Arm, Patient Position: Sitting, Cuff Size: Large)   Pulse 76   Ht 5\' 2"  (1.575 m)   Wt 195 lb 9.6 oz (88.7 kg)   SpO2 98%   BMI 35.78 kg/m     Wt Readings from Last 3 Encounters:  11/15/17 195 lb 9.6 oz (88.7 kg)  10/18/17 196 lb (88.9 kg)  09/28/17 196 lb 2 oz (89 kg)     GEN:  Well nourished, well developed in no acute distress HEENT:  Normal NECK: No JVD; No carotid bruits LYMPHATICS: No lymphadenopathy CARDIAC: Rhythm is irregular S1 variable , no murmurs, rubs, gallops RESPIRATORY:  Clear to auscultation without rales, wheezing or rhonchi  ABDOMEN: Soft, non-tender, non-distended MUSCULOSKELETAL:  No edema; No deformity  SKIN: Warm and dry NEUROLOGIC:  Alert and oriented x 3 PSYCHIATRIC:  Normal affect    Signed, Norman Herrlich, MD  11/15/2017 10:56 AM    [facility]

## 2017-11-15 ENCOUNTER — Ambulatory Visit (INDEPENDENT_AMBULATORY_CARE_PROVIDER_SITE_OTHER): Payer: Medicare HMO | Admitting: Cardiology

## 2017-11-15 ENCOUNTER — Encounter: Payer: Self-pay | Admitting: Cardiology

## 2017-11-15 VITALS — BP 138/60 | HR 76 | Ht 62.0 in | Wt 195.6 lb

## 2017-11-15 DIAGNOSIS — Z7901 Long term (current) use of anticoagulants: Secondary | ICD-10-CM

## 2017-11-15 DIAGNOSIS — I4819 Other persistent atrial fibrillation: Secondary | ICD-10-CM | POA: Diagnosis not present

## 2017-11-15 DIAGNOSIS — I110 Hypertensive heart disease with heart failure: Secondary | ICD-10-CM

## 2017-11-15 DIAGNOSIS — I5032 Chronic diastolic (congestive) heart failure: Secondary | ICD-10-CM | POA: Diagnosis not present

## 2017-11-15 DIAGNOSIS — E785 Hyperlipidemia, unspecified: Secondary | ICD-10-CM

## 2017-11-15 NOTE — Patient Instructions (Signed)
Medication Instructions:  Your physician recommends that you continue on your current medications as directed. Please refer to the Current Medication list given to you today.   Labwork: None  Testing/Procedures: You had an EKG today.   Follow-Up: Your physician wants you to follow-up in: 6 months. You will receive a reminder letter in the mail two months in advance. If you don't receive a letter, please call our office to schedule the follow-up appointment.   If you need a refill on your cardiac medications before your next appointment, please call your pharmacy.   Thank you for choosing [facility]! Catherine Lockhart, RN [phone]    

## 2017-11-20 DIAGNOSIS — H2512 Age-related nuclear cataract, left eye: Secondary | ICD-10-CM | POA: Diagnosis not present

## 2017-11-30 DIAGNOSIS — Z23 Encounter for immunization: Secondary | ICD-10-CM | POA: Diagnosis not present

## 2018-01-17 DIAGNOSIS — E538 Deficiency of other specified B group vitamins: Secondary | ICD-10-CM | POA: Diagnosis not present

## 2018-01-17 DIAGNOSIS — I4891 Unspecified atrial fibrillation: Secondary | ICD-10-CM | POA: Diagnosis not present

## 2018-01-17 DIAGNOSIS — Z9181 History of falling: Secondary | ICD-10-CM | POA: Diagnosis not present

## 2018-01-17 DIAGNOSIS — E559 Vitamin D deficiency, unspecified: Secondary | ICD-10-CM | POA: Diagnosis not present

## 2018-01-17 DIAGNOSIS — I130 Hypertensive heart and chronic kidney disease with heart failure and stage 1 through stage 4 chronic kidney disease, or unspecified chronic kidney disease: Secondary | ICD-10-CM | POA: Diagnosis not present

## 2018-01-17 DIAGNOSIS — Z1331 Encounter for screening for depression: Secondary | ICD-10-CM | POA: Diagnosis not present

## 2018-01-17 DIAGNOSIS — E7800 Pure hypercholesterolemia, unspecified: Secondary | ICD-10-CM | POA: Diagnosis not present

## 2018-01-26 DIAGNOSIS — I110 Hypertensive heart disease with heart failure: Secondary | ICD-10-CM | POA: Diagnosis not present

## 2018-01-26 DIAGNOSIS — R109 Unspecified abdominal pain: Secondary | ICD-10-CM | POA: Diagnosis not present

## 2018-01-26 DIAGNOSIS — R05 Cough: Secondary | ICD-10-CM | POA: Diagnosis not present

## 2018-01-26 DIAGNOSIS — Z9181 History of falling: Secondary | ICD-10-CM | POA: Diagnosis not present

## 2018-01-26 DIAGNOSIS — R531 Weakness: Secondary | ICD-10-CM | POA: Diagnosis not present

## 2018-01-26 DIAGNOSIS — I5031 Acute diastolic (congestive) heart failure: Secondary | ICD-10-CM | POA: Diagnosis not present

## 2018-01-26 DIAGNOSIS — M79661 Pain in right lower leg: Secondary | ICD-10-CM | POA: Diagnosis not present

## 2018-01-26 DIAGNOSIS — R4182 Altered mental status, unspecified: Secondary | ICD-10-CM | POA: Diagnosis not present

## 2018-01-26 DIAGNOSIS — R69 Illness, unspecified: Secondary | ICD-10-CM | POA: Diagnosis not present

## 2018-01-26 DIAGNOSIS — J209 Acute bronchitis, unspecified: Secondary | ICD-10-CM | POA: Diagnosis not present

## 2018-01-26 DIAGNOSIS — J069 Acute upper respiratory infection, unspecified: Secondary | ICD-10-CM | POA: Diagnosis not present

## 2018-01-26 DIAGNOSIS — E785 Hyperlipidemia, unspecified: Secondary | ICD-10-CM | POA: Diagnosis not present

## 2018-01-26 DIAGNOSIS — G9341 Metabolic encephalopathy: Secondary | ICD-10-CM | POA: Diagnosis not present

## 2018-01-26 DIAGNOSIS — I4891 Unspecified atrial fibrillation: Secondary | ICD-10-CM | POA: Diagnosis not present

## 2018-01-27 DIAGNOSIS — I4891 Unspecified atrial fibrillation: Secondary | ICD-10-CM | POA: Diagnosis not present

## 2018-01-27 DIAGNOSIS — R4182 Altered mental status, unspecified: Secondary | ICD-10-CM | POA: Diagnosis not present

## 2018-01-28 DIAGNOSIS — I739 Peripheral vascular disease, unspecified: Secondary | ICD-10-CM | POA: Diagnosis not present

## 2018-01-28 DIAGNOSIS — J449 Chronic obstructive pulmonary disease, unspecified: Secondary | ICD-10-CM | POA: Diagnosis not present

## 2018-01-28 DIAGNOSIS — I5020 Unspecified systolic (congestive) heart failure: Secondary | ICD-10-CM | POA: Diagnosis not present

## 2018-01-28 DIAGNOSIS — R69 Illness, unspecified: Secondary | ICD-10-CM | POA: Diagnosis not present

## 2018-01-28 DIAGNOSIS — N183 Chronic kidney disease, stage 3 (moderate): Secondary | ICD-10-CM | POA: Diagnosis not present

## 2018-01-28 DIAGNOSIS — I130 Hypertensive heart and chronic kidney disease with heart failure and stage 1 through stage 4 chronic kidney disease, or unspecified chronic kidney disease: Secondary | ICD-10-CM | POA: Diagnosis not present

## 2018-01-28 DIAGNOSIS — I2510 Atherosclerotic heart disease of native coronary artery without angina pectoris: Secondary | ICD-10-CM | POA: Diagnosis not present

## 2018-01-28 DIAGNOSIS — J189 Pneumonia, unspecified organism: Secondary | ICD-10-CM | POA: Diagnosis not present

## 2018-01-28 DIAGNOSIS — I480 Paroxysmal atrial fibrillation: Secondary | ICD-10-CM | POA: Diagnosis not present

## 2018-01-28 DIAGNOSIS — D631 Anemia in chronic kidney disease: Secondary | ICD-10-CM | POA: Diagnosis not present

## 2018-01-29 DIAGNOSIS — R109 Unspecified abdominal pain: Secondary | ICD-10-CM | POA: Diagnosis not present

## 2018-01-29 DIAGNOSIS — R69 Illness, unspecified: Secondary | ICD-10-CM | POA: Diagnosis not present

## 2018-01-29 DIAGNOSIS — R1084 Generalized abdominal pain: Secondary | ICD-10-CM | POA: Diagnosis not present

## 2018-01-29 DIAGNOSIS — J209 Acute bronchitis, unspecified: Secondary | ICD-10-CM | POA: Diagnosis not present

## 2018-01-29 DIAGNOSIS — R197 Diarrhea, unspecified: Secondary | ICD-10-CM | POA: Diagnosis not present

## 2018-01-29 DIAGNOSIS — R05 Cough: Secondary | ICD-10-CM | POA: Diagnosis not present

## 2018-01-31 DIAGNOSIS — J189 Pneumonia, unspecified organism: Secondary | ICD-10-CM | POA: Diagnosis not present

## 2018-01-31 DIAGNOSIS — I480 Paroxysmal atrial fibrillation: Secondary | ICD-10-CM | POA: Diagnosis not present

## 2018-01-31 DIAGNOSIS — I130 Hypertensive heart and chronic kidney disease with heart failure and stage 1 through stage 4 chronic kidney disease, or unspecified chronic kidney disease: Secondary | ICD-10-CM | POA: Diagnosis not present

## 2018-01-31 DIAGNOSIS — R69 Illness, unspecified: Secondary | ICD-10-CM | POA: Diagnosis not present

## 2018-01-31 DIAGNOSIS — I739 Peripheral vascular disease, unspecified: Secondary | ICD-10-CM | POA: Diagnosis not present

## 2018-01-31 DIAGNOSIS — D631 Anemia in chronic kidney disease: Secondary | ICD-10-CM | POA: Diagnosis not present

## 2018-01-31 DIAGNOSIS — J449 Chronic obstructive pulmonary disease, unspecified: Secondary | ICD-10-CM | POA: Diagnosis not present

## 2018-01-31 DIAGNOSIS — N183 Chronic kidney disease, stage 3 (moderate): Secondary | ICD-10-CM | POA: Diagnosis not present

## 2018-01-31 DIAGNOSIS — I5020 Unspecified systolic (congestive) heart failure: Secondary | ICD-10-CM | POA: Diagnosis not present

## 2018-01-31 DIAGNOSIS — I2510 Atherosclerotic heart disease of native coronary artery without angina pectoris: Secondary | ICD-10-CM | POA: Diagnosis not present

## 2018-02-01 DIAGNOSIS — J208 Acute bronchitis due to other specified organisms: Secondary | ICD-10-CM | POA: Diagnosis not present

## 2018-02-01 DIAGNOSIS — Z09 Encounter for follow-up examination after completed treatment for conditions other than malignant neoplasm: Secondary | ICD-10-CM | POA: Diagnosis not present

## 2018-02-02 DIAGNOSIS — I2510 Atherosclerotic heart disease of native coronary artery without angina pectoris: Secondary | ICD-10-CM | POA: Diagnosis not present

## 2018-02-02 DIAGNOSIS — D631 Anemia in chronic kidney disease: Secondary | ICD-10-CM | POA: Diagnosis not present

## 2018-02-02 DIAGNOSIS — J449 Chronic obstructive pulmonary disease, unspecified: Secondary | ICD-10-CM | POA: Diagnosis not present

## 2018-02-02 DIAGNOSIS — I130 Hypertensive heart and chronic kidney disease with heart failure and stage 1 through stage 4 chronic kidney disease, or unspecified chronic kidney disease: Secondary | ICD-10-CM | POA: Diagnosis not present

## 2018-02-02 DIAGNOSIS — I480 Paroxysmal atrial fibrillation: Secondary | ICD-10-CM | POA: Diagnosis not present

## 2018-02-02 DIAGNOSIS — I739 Peripheral vascular disease, unspecified: Secondary | ICD-10-CM | POA: Diagnosis not present

## 2018-02-02 DIAGNOSIS — J189 Pneumonia, unspecified organism: Secondary | ICD-10-CM | POA: Diagnosis not present

## 2018-02-02 DIAGNOSIS — I5020 Unspecified systolic (congestive) heart failure: Secondary | ICD-10-CM | POA: Diagnosis not present

## 2018-02-02 DIAGNOSIS — R69 Illness, unspecified: Secondary | ICD-10-CM | POA: Diagnosis not present

## 2018-02-02 DIAGNOSIS — N183 Chronic kidney disease, stage 3 (moderate): Secondary | ICD-10-CM | POA: Diagnosis not present

## 2018-02-04 DIAGNOSIS — J189 Pneumonia, unspecified organism: Secondary | ICD-10-CM | POA: Diagnosis not present

## 2018-02-04 DIAGNOSIS — I739 Peripheral vascular disease, unspecified: Secondary | ICD-10-CM | POA: Diagnosis not present

## 2018-02-04 DIAGNOSIS — I5020 Unspecified systolic (congestive) heart failure: Secondary | ICD-10-CM | POA: Diagnosis not present

## 2018-02-04 DIAGNOSIS — I480 Paroxysmal atrial fibrillation: Secondary | ICD-10-CM | POA: Diagnosis not present

## 2018-02-04 DIAGNOSIS — D631 Anemia in chronic kidney disease: Secondary | ICD-10-CM | POA: Diagnosis not present

## 2018-02-04 DIAGNOSIS — R69 Illness, unspecified: Secondary | ICD-10-CM | POA: Diagnosis not present

## 2018-02-04 DIAGNOSIS — N183 Chronic kidney disease, stage 3 (moderate): Secondary | ICD-10-CM | POA: Diagnosis not present

## 2018-02-04 DIAGNOSIS — I2510 Atherosclerotic heart disease of native coronary artery without angina pectoris: Secondary | ICD-10-CM | POA: Diagnosis not present

## 2018-02-04 DIAGNOSIS — J449 Chronic obstructive pulmonary disease, unspecified: Secondary | ICD-10-CM | POA: Diagnosis not present

## 2018-02-04 DIAGNOSIS — I130 Hypertensive heart and chronic kidney disease with heart failure and stage 1 through stage 4 chronic kidney disease, or unspecified chronic kidney disease: Secondary | ICD-10-CM | POA: Diagnosis not present

## 2018-02-06 DIAGNOSIS — I130 Hypertensive heart and chronic kidney disease with heart failure and stage 1 through stage 4 chronic kidney disease, or unspecified chronic kidney disease: Secondary | ICD-10-CM | POA: Diagnosis not present

## 2018-02-06 DIAGNOSIS — N183 Chronic kidney disease, stage 3 (moderate): Secondary | ICD-10-CM | POA: Diagnosis not present

## 2018-02-06 DIAGNOSIS — J449 Chronic obstructive pulmonary disease, unspecified: Secondary | ICD-10-CM | POA: Diagnosis not present

## 2018-02-06 DIAGNOSIS — I739 Peripheral vascular disease, unspecified: Secondary | ICD-10-CM | POA: Diagnosis not present

## 2018-02-06 DIAGNOSIS — J189 Pneumonia, unspecified organism: Secondary | ICD-10-CM | POA: Diagnosis not present

## 2018-02-06 DIAGNOSIS — I5020 Unspecified systolic (congestive) heart failure: Secondary | ICD-10-CM | POA: Diagnosis not present

## 2018-02-06 DIAGNOSIS — I480 Paroxysmal atrial fibrillation: Secondary | ICD-10-CM | POA: Diagnosis not present

## 2018-02-06 DIAGNOSIS — R69 Illness, unspecified: Secondary | ICD-10-CM | POA: Diagnosis not present

## 2018-02-06 DIAGNOSIS — I2510 Atherosclerotic heart disease of native coronary artery without angina pectoris: Secondary | ICD-10-CM | POA: Diagnosis not present

## 2018-02-06 DIAGNOSIS — D631 Anemia in chronic kidney disease: Secondary | ICD-10-CM | POA: Diagnosis not present

## 2018-02-20 DIAGNOSIS — I2510 Atherosclerotic heart disease of native coronary artery without angina pectoris: Secondary | ICD-10-CM | POA: Diagnosis not present

## 2018-02-20 DIAGNOSIS — N183 Chronic kidney disease, stage 3 (moderate): Secondary | ICD-10-CM | POA: Diagnosis not present

## 2018-02-20 DIAGNOSIS — I739 Peripheral vascular disease, unspecified: Secondary | ICD-10-CM | POA: Diagnosis not present

## 2018-02-20 DIAGNOSIS — J189 Pneumonia, unspecified organism: Secondary | ICD-10-CM | POA: Diagnosis not present

## 2018-02-20 DIAGNOSIS — D631 Anemia in chronic kidney disease: Secondary | ICD-10-CM | POA: Diagnosis not present

## 2018-02-20 DIAGNOSIS — I480 Paroxysmal atrial fibrillation: Secondary | ICD-10-CM | POA: Diagnosis not present

## 2018-02-20 DIAGNOSIS — I5020 Unspecified systolic (congestive) heart failure: Secondary | ICD-10-CM | POA: Diagnosis not present

## 2018-02-20 DIAGNOSIS — I130 Hypertensive heart and chronic kidney disease with heart failure and stage 1 through stage 4 chronic kidney disease, or unspecified chronic kidney disease: Secondary | ICD-10-CM | POA: Diagnosis not present

## 2018-02-20 DIAGNOSIS — R69 Illness, unspecified: Secondary | ICD-10-CM | POA: Diagnosis not present

## 2018-02-20 DIAGNOSIS — J449 Chronic obstructive pulmonary disease, unspecified: Secondary | ICD-10-CM | POA: Diagnosis not present

## 2018-02-28 DIAGNOSIS — R69 Illness, unspecified: Secondary | ICD-10-CM | POA: Diagnosis not present

## 2018-02-28 DIAGNOSIS — I2510 Atherosclerotic heart disease of native coronary artery without angina pectoris: Secondary | ICD-10-CM | POA: Diagnosis not present

## 2018-02-28 DIAGNOSIS — D631 Anemia in chronic kidney disease: Secondary | ICD-10-CM | POA: Diagnosis not present

## 2018-02-28 DIAGNOSIS — N183 Chronic kidney disease, stage 3 (moderate): Secondary | ICD-10-CM | POA: Diagnosis not present

## 2018-02-28 DIAGNOSIS — J189 Pneumonia, unspecified organism: Secondary | ICD-10-CM | POA: Diagnosis not present

## 2018-02-28 DIAGNOSIS — I130 Hypertensive heart and chronic kidney disease with heart failure and stage 1 through stage 4 chronic kidney disease, or unspecified chronic kidney disease: Secondary | ICD-10-CM | POA: Diagnosis not present

## 2018-02-28 DIAGNOSIS — I480 Paroxysmal atrial fibrillation: Secondary | ICD-10-CM | POA: Diagnosis not present

## 2018-02-28 DIAGNOSIS — I5020 Unspecified systolic (congestive) heart failure: Secondary | ICD-10-CM | POA: Diagnosis not present

## 2018-02-28 DIAGNOSIS — J449 Chronic obstructive pulmonary disease, unspecified: Secondary | ICD-10-CM | POA: Diagnosis not present

## 2018-02-28 DIAGNOSIS — I739 Peripheral vascular disease, unspecified: Secondary | ICD-10-CM | POA: Diagnosis not present

## 2018-03-02 DIAGNOSIS — R05 Cough: Secondary | ICD-10-CM | POA: Diagnosis not present

## 2018-03-06 DIAGNOSIS — I130 Hypertensive heart and chronic kidney disease with heart failure and stage 1 through stage 4 chronic kidney disease, or unspecified chronic kidney disease: Secondary | ICD-10-CM | POA: Diagnosis not present

## 2018-03-06 DIAGNOSIS — N183 Chronic kidney disease, stage 3 (moderate): Secondary | ICD-10-CM | POA: Diagnosis not present

## 2018-03-06 DIAGNOSIS — R69 Illness, unspecified: Secondary | ICD-10-CM | POA: Diagnosis not present

## 2018-03-06 DIAGNOSIS — I739 Peripheral vascular disease, unspecified: Secondary | ICD-10-CM | POA: Diagnosis not present

## 2018-03-06 DIAGNOSIS — I2510 Atherosclerotic heart disease of native coronary artery without angina pectoris: Secondary | ICD-10-CM | POA: Diagnosis not present

## 2018-03-06 DIAGNOSIS — D631 Anemia in chronic kidney disease: Secondary | ICD-10-CM | POA: Diagnosis not present

## 2018-03-06 DIAGNOSIS — J449 Chronic obstructive pulmonary disease, unspecified: Secondary | ICD-10-CM | POA: Diagnosis not present

## 2018-03-06 DIAGNOSIS — I480 Paroxysmal atrial fibrillation: Secondary | ICD-10-CM | POA: Diagnosis not present

## 2018-03-06 DIAGNOSIS — J189 Pneumonia, unspecified organism: Secondary | ICD-10-CM | POA: Diagnosis not present

## 2018-03-06 DIAGNOSIS — I5020 Unspecified systolic (congestive) heart failure: Secondary | ICD-10-CM | POA: Diagnosis not present

## 2018-03-20 DIAGNOSIS — R69 Illness, unspecified: Secondary | ICD-10-CM | POA: Diagnosis not present

## 2018-03-20 DIAGNOSIS — J449 Chronic obstructive pulmonary disease, unspecified: Secondary | ICD-10-CM | POA: Diagnosis not present

## 2018-03-20 DIAGNOSIS — I5020 Unspecified systolic (congestive) heart failure: Secondary | ICD-10-CM | POA: Diagnosis not present

## 2018-03-20 DIAGNOSIS — I480 Paroxysmal atrial fibrillation: Secondary | ICD-10-CM | POA: Diagnosis not present

## 2018-03-20 DIAGNOSIS — I739 Peripheral vascular disease, unspecified: Secondary | ICD-10-CM | POA: Diagnosis not present

## 2018-03-20 DIAGNOSIS — I130 Hypertensive heart and chronic kidney disease with heart failure and stage 1 through stage 4 chronic kidney disease, or unspecified chronic kidney disease: Secondary | ICD-10-CM | POA: Diagnosis not present

## 2018-03-20 DIAGNOSIS — J189 Pneumonia, unspecified organism: Secondary | ICD-10-CM | POA: Diagnosis not present

## 2018-03-20 DIAGNOSIS — D631 Anemia in chronic kidney disease: Secondary | ICD-10-CM | POA: Diagnosis not present

## 2018-03-20 DIAGNOSIS — I2510 Atherosclerotic heart disease of native coronary artery without angina pectoris: Secondary | ICD-10-CM | POA: Diagnosis not present

## 2018-03-20 DIAGNOSIS — N183 Chronic kidney disease, stage 3 (moderate): Secondary | ICD-10-CM | POA: Diagnosis not present

## 2018-05-11 ENCOUNTER — Ambulatory Visit: Payer: Medicare HMO | Admitting: Cardiology

## 2018-06-07 ENCOUNTER — Other Ambulatory Visit: Payer: Self-pay

## 2018-06-07 MED ORDER — FUROSEMIDE 20 MG PO TABS: 40.0000 mg | ORAL_TABLET | Freq: Every day | ORAL | 0 refills | Status: DC

## 2018-07-06 ENCOUNTER — Telehealth: Payer: Self-pay | Admitting: Cardiology

## 2018-07-06 NOTE — Telephone Encounter (Signed)
Virtual Visit Pre-Appointment Phone Call  "(Name), I am calling you today to discuss your upcoming appointment. We are currently trying to limit exposure to the virus that causes COVID-19 by seeing patients at home rather than in the office."  1. "What is the BEST phone number to call the day of the visit?" - include this in appointment notes  2. Do you have or have access to (through a family member/friend) a smartphone with video capability that we can use for your visit?" a. If yes - list this number in appt notes as cell (if different from BEST phone #) and list the appointment type as a VIDEO visit in appointment notes b. If no - list the appointment type as a PHONE visit in appointment notes  3. Confirm consent - "In the setting of the current Covid19 crisis, you are scheduled for a (phone or video) visit with your provider on (date) at (time).  Just as we do with many in-office visits, in order for you to participate in this visit, we must obtain consent.  If you'd like, I can send this to your mychart (if signed up) or email for you to review.  Otherwise, I can obtain your verbal consent now.  All virtual visits are billed to your insurance company just like a normal visit would be.  By agreeing to a virtual visit, we'd like you to understand that the technology does not allow for your provider to perform an examination, and thus may limit your provider's ability to fully assess your condition. If your provider identifies any concerns that need to be evaluated in person, we will make arrangements to do so.  Finally, though the technology is pretty good, we cannot assure that it will always work on either your or our end, and in the setting of a video visit, we may have to convert it to a phone-only visit.  In either situation, we cannot ensure that we have a secure connection.  Are you willing to proceed?" STAFF: Did the patient verbally acknowledge consent to telehealth visit? Document  YES/NO here: Yes per daughter  524. Advise patient to be prepared - "Two hours prior to your appointment, go ahead and check your blood pressure, pulse, oxygen saturation, and your weight (if you have the equipment to check those) and write them all down. When your visit starts, your provider will ask you for this information. If you have an Apple Watch or Kardia device, please plan to have heart rate information ready on the day of your appointment. Please have a pen and paper handy nearby the day of the visit as well."  5. Give patient instructions for MyChart download to smartphone OR Doximity/Doxy.me as below if video visit (depending on what platform provider is using)  6. Inform patient they will receive a phone call 15 minutes prior to their appointment time (may be from unknown caller ID) so they should be prepared to answer    TELEPHONE CALL NOTE  Robin Salazar has been deemed a candidate for a follow-up tele-health visit to limit community exposure during the Covid-19 pandemic. I spoke with the patient via phone to ensure availability of phone/video source, confirm preferred email & phone number, and discuss instructions and expectations.  I reminded Robin Salazar to be prepared with any vital sign and/or heart rhythm information that could potentially be obtained via home monitoring, at the time of her visit. I reminded Robin Salazar to expect a phone call prior to her  visit.  Alvy Beal 07/06/2018 3:11 PM   INSTRUCTIONS FOR DOWNLOADING THE MYCHART APP TO SMARTPHONE  - The patient must first make sure to have activated MyChart and know their login information - If Apple, go to [employer] and type in MyChart in the search bar and download the app. If Android, ask patient to go to [employer] and type in [place] in the search bar and download the app. The app is free but as with any other app downloads, their phone may require them to verify saved payment information or  Apple/Android password.  - The patient will need to then log into the app with their MyChart username and password, and select [facility] as their healthcare provider to link the account. When it is time for your visit, go to the MyChart app, find appointments, and click Begin Video Visit. Be sure to Select Allow for your device to access the Microphone and Camera for your visit. You will then be connected, and your provider will be with you shortly.  **If they have any issues connecting, or need assistance please contact MyChart service desk [phone])**  **If using a computer, in order to ensure the best quality for their visit they will need to use either of the following Internet Browsers: [employer] or Google Chrome**  IF USING DOXIMITY or DOXY.ME - The patient will receive a link just prior to their visit by text.     FULL LENGTH CONSENT FOR TELE-HEALTH VISIT   I hereby voluntarily request, consent and authorize [facility] and its employed or contracted physicians, physician assistants, nurse practitioners or other licensed health care professionals (the Practitioner), to provide me with telemedicine health care services (the Services") as deemed necessary by the treating Practitioner. I acknowledge and consent to receive the Services by the Practitioner via telemedicine. I understand that the telemedicine visit will involve communicating with the Practitioner through live audiovisual communication technology and the disclosure of certain medical information by electronic transmission. I acknowledge that I have been given the opportunity to request an in-person assessment or other available alternative prior to the telemedicine visit and am voluntarily participating in the telemedicine visit.  I understand that I have the right to withhold or withdraw my consent to the use of telemedicine in the course of my care at any time, without affecting my right to future care  or treatment, and that the Practitioner or I may terminate the telemedicine visit at any time. I understand that I have the right to inspect all information obtained and/or recorded in the course of the telemedicine visit and may receive copies of available information for a reasonable fee.  I understand that some of the potential risks of receiving the Services via telemedicine include:   Delay or interruption in medical evaluation due to technological equipment failure or disruption;  Information transmitted may not be sufficient (e.g. poor resolution of images) to allow for appropriate medical decision making by the Practitioner; and/or   In rare instances, security protocols could fail, causing a breach of personal health information.  Furthermore, I acknowledge that it is my responsibility to provide information about my medical history, conditions and care that is complete and accurate to the best of my ability. I acknowledge that Practitioner's advice, recommendations, and/or decision may be based on factors not within their control, such as incomplete or inaccurate data provided by me or distortions of diagnostic images or specimens that may result from electronic transmissions. I understand  that the practice of medicine is not an exact science and that Practitioner makes no warranties or guarantees regarding treatment outcomes. I acknowledge that I will receive a copy of this consent concurrently upon execution via email to the email address I last provided but may also request a printed copy by calling the office of [facility].    I understand that my insurance will be billed for this visit.   I have read or had this consent read to me.  I understand the contents of this consent, which adequately explains the benefits and risks of the Services being provided via telemedicine.   I have been provided ample opportunity to ask questions regarding this consent and the Services and have had  my questions answered to my satisfaction.  I give my informed consent for the services to be provided through the use of telemedicine in my medical care  By participating in this telemedicine visit I agree to the above.

## 2018-07-10 NOTE — Progress Notes (Signed)
Virtual Visit via Telephone Note   This visit type was conducted due to national recommendations for restrictions regarding the COVID-19 Pandemic (e.g. social distancing) in an effort to limit this patient's exposure and mitigate transmission in our community.  Due to her co-morbid illnesses, this patient is at least at moderate risk for complications without adequate follow up.  This format is felt to be most appropriate for this patient at this time.  The patient did not have access to video technology/had technical difficulties with video requiring transitioning to audio format only (telephone).  All issues noted in this document were discussed and addressed.  No physical exam could be performed with this format.  Please refer to the patient's chart for her  consent to telehealth for [facility].   Date:  07/11/2018   ID:  Robin Salazar, DOB 1940-08-25, MRN [id number]  Patient Location: Home Provider Location: Office  PCP:  Paulina Fusi, MD  Cardiologist:  Norman Herrlich, MD  Electrophysiologist:  None   Evaluation Performed:  Follow-Up Visit  Chief Complaint:  Heart failure and atrial fibrillation  History of Present Illness:    Robin Salazar is a 78 y.o. female with Diastolic CHF, persitent Atrial Fibrillation/atypical atrial flutter, HTN, Hyperlipidemia last seen 11/15/17.   The patient does not have symptoms concerning for COVID-19 infection (fever, chills, cough, or new shortness of breath).   Despite her daughter being present and best efforts we cannot establish a video link due to lack of Internet access.  Her weights are stable at home she has a mild degree of edema it is chronic but no orthopnea shortness of breath chest pain palpitation or syncope.  I asked her daughter to purchase an arm blood pressure cuff goal blood pressure in the range of 130 systolic and heart rate of 60 to 90 bpm.  Recent labs were performed at her PCP office I will try to access them through  the [facility] system  Past Medical History:  Diagnosis Date  . Allergy   . Arthritis   . Asthma    anoro - inhaler  . Cataract    left eye  . CHF (congestive heart failure) (HCC)   . Chronic anticoagulation 09/09/2015  . Clotting disorder (HCC)    anticoagulant - a fib  . COPD (chronic obstructive pulmonary disease) (HCC)   . Hyperlipidemia 11/03/2016  . Hypertension   . Hypertensive heart disease with heart failure (HCC) 09/09/2015  . Persistent atrial fibrillation 09/09/2015   Past Surgical History:  Procedure Laterality Date  . ABDOMINAL HYSTERECTOMY    . APPENDECTOMY    . CHOLECYSTECTOMY    . COLONOSCOPY    . TONSILLECTOMY       Current Meds  Medication Sig  . ANORO ELLIPTA 62.5-25 MCG/INH AEPB TAKE 1 PUFF BY MOUTH EVERY DAY  . apixaban (ELIQUIS) 5 MG TABS tablet Take 5 mg by mouth 2 (two) times daily.  Marland Kitchen CALCIUM PO Take 1 tablet by mouth daily.  . cetirizine (ZYRTEC ALLERGY) 10 MG tablet Take 10 mg by mouth daily.  . DOCOSAHEXAENOIC ACID PO Take 2 g by mouth daily.  Marland Kitchen donepezil (ARICEPT) 10 MG tablet Take 10 mg by mouth daily.  . furosemide (LASIX) 20 MG tablet Take 2 tablets (40 mg total) by mouth daily.  Marland Kitchen KLOR-CON M20 20 MEQ tablet Take 20 mEq by mouth 2 (two) times daily.  . metoprolol tartrate (LOPRESSOR) 25 MG tablet 2 tablets in the Am and 1 tablet in the pm  .  Multiple Vitamin (MULTI-VITAMINS) TABS Take 1 tablet by mouth daily.  . simvastatin (ZOCOR) 40 MG tablet Take 40 mg by mouth daily.  Marland Kitchen. telmisartan (MICARDIS) 80 MG tablet Take 80 mg by mouth daily.  . Vitamin D, Ergocalciferol, (DRISDOL) 50000 units CAPS capsule Take 1 capsule by mouth once a week.   Current Facility-Administered Medications for the 07/11/18 encounter (Telemedicine) with Baldo DaubMunley, Brian J, MD  Medication  . 0.9 %  sodium chloride infusion     Allergies:   Contrast media [iodinated diagnostic agents]   Social History   Tobacco Use  . Smoking status: Never Smoker  . Smokeless tobacco:  Never Used  Substance Use Topics  . Alcohol use: No  . Drug use: No     Family Hx: The patient's family history includes Diabetes in her father; Heart disease in her father and mother. There is no history of Colon cancer, Esophageal cancer, Rectal cancer, or Stomach cancer.  ROS:   Please see the history of present illness.     All other systems reviewed and are negative.   Prior CV studies:   The following studies were reviewed today:    Labs/Other Tests and Data Reviewed:    EKG:  No ECG reviewed.  Recent Labs: 01/17/2018: Cholesterol 144 HDL 67 LDL 55 Hemoglobin 13.1 CMP is normal creatinine 1.06 normal liver function Test results reviewed and incorporated into decision making  Wt Readings from Last 3 Encounters:  07/11/18 191 lb (86.6 kg)  11/15/17 195 lb 9.6 oz (88.7 kg)  10/18/17 196 lb (88.9 kg)     Objective:    Vital Signs:  Ht 5\' 2"  (1.575 m)   Wt 191 lb (86.6 kg)   BMI 34.93 kg/m    VITAL SIGNS:  reviewed her mood affect thought and cognition are normal alert and oriented x3 no wheezing or respiratory distress during conversation  ASSESSMENT & PLAN:    1. Heart failure chronic diastolic compensated continue current diuretic and I will asked her PCP to do a proBNP level and BMP at her next office visit in June I encouraged her to continue to weigh daily and sodium restrict. 2. Hypertensive heart disease she needs to purchase and utilize a blood pressure cuff at home goal systolic in the range of 130 and continue her ARB 3. Atrial fibrillation rate controlled continue beta-blocker monitor pulse at home especially with Aricept that can potentiate bradycardia 4. Anticoagulation stable continue Eliquis no dose adjustment needed 5. Hyperlipidemia stable continue statin and access recent labs for PCP office for safety liver function efficacy LDL  COVID-19 Education: The signs and symptoms of COVID-19 were discussed with the patient and how to seek care for  testing (follow up with PCP or arrange E-visit).  The importance of social distancing was discussed today.  Time:   Today, I have spent 20 minutes with the patient with telehealth technology discussing the above problems.     Medication Adjustments/Labs and Tests Ordered: Current medicines are reviewed at length with the patient today.  Concerns regarding medicines are outlined above.   Tests Ordered: No orders of the defined types were placed in this encounter.   Medication Changes: No orders of the defined types were placed in this encounter.   Disposition:  Follow up in 3 month(s)  Signed, Norman HerrlichBrian Munley, MD  07/11/2018 8:53 AM    [facility]

## 2018-07-11 ENCOUNTER — Encounter: Payer: Self-pay | Admitting: Cardiology

## 2018-07-11 ENCOUNTER — Other Ambulatory Visit: Payer: Self-pay

## 2018-07-11 ENCOUNTER — Telehealth (INDEPENDENT_AMBULATORY_CARE_PROVIDER_SITE_OTHER): Payer: Medicare HMO | Admitting: Cardiology

## 2018-07-11 VITALS — Ht 62.0 in | Wt 191.0 lb

## 2018-07-11 DIAGNOSIS — E785 Hyperlipidemia, unspecified: Secondary | ICD-10-CM

## 2018-07-11 DIAGNOSIS — E782 Mixed hyperlipidemia: Secondary | ICD-10-CM

## 2018-07-11 DIAGNOSIS — Z7901 Long term (current) use of anticoagulants: Secondary | ICD-10-CM | POA: Diagnosis not present

## 2018-07-11 DIAGNOSIS — I4819 Other persistent atrial fibrillation: Secondary | ICD-10-CM

## 2018-07-11 DIAGNOSIS — I5032 Chronic diastolic (congestive) heart failure: Secondary | ICD-10-CM

## 2018-07-11 DIAGNOSIS — I110 Hypertensive heart disease with heart failure: Secondary | ICD-10-CM | POA: Diagnosis not present

## 2018-07-11 DIAGNOSIS — I4891 Unspecified atrial fibrillation: Secondary | ICD-10-CM | POA: Diagnosis not present

## 2018-07-11 NOTE — Patient Instructions (Addendum)
Medication Instructions:  Your physician recommends that you continue on your current medications as directed. Please refer to the Current Medication list given to you today.  If you need a refill on your cardiac medications before your next appointment, please call your pharmacy.   Lab work: None  If you have labs (blood work) drawn today and your tests are completely normal, you will receive your results only by: Marland Kitchen MyChart Message (if you have MyChart) OR . A paper copy in the mail If you have any lab test that is abnormal or we need to change your treatment, we will call you to review the results.  Testing/Procedures: None  Follow-Up: At [facility], you and your health needs are our priority.  As part of our continuing mission to provide you with exceptional heart care, we have created designated Provider Care Teams.  These Care Teams include your primary Cardiologist (physician) and Advanced Practice Providers (APPs -  Physician Assistants and Nurse Practitioners) who all work together to provide you with the care you need, when you need it. . You will need a follow up appointment in 3 months: Wednesday, 10/10/2018, at 9:30 am in the [place] office.   Any Other Special Instructions Will Be Listed Below (If Applicable). **Buy a blood pressure cuff to monitor your BP at home

## 2018-07-24 DIAGNOSIS — E559 Vitamin D deficiency, unspecified: Secondary | ICD-10-CM | POA: Diagnosis not present

## 2018-07-24 DIAGNOSIS — E7800 Pure hypercholesterolemia, unspecified: Secondary | ICD-10-CM | POA: Diagnosis not present

## 2018-07-24 DIAGNOSIS — I4891 Unspecified atrial fibrillation: Secondary | ICD-10-CM | POA: Diagnosis not present

## 2018-07-24 DIAGNOSIS — N183 Chronic kidney disease, stage 3 (moderate): Secondary | ICD-10-CM | POA: Diagnosis not present

## 2018-07-27 DIAGNOSIS — I130 Hypertensive heart and chronic kidney disease with heart failure and stage 1 through stage 4 chronic kidney disease, or unspecified chronic kidney disease: Secondary | ICD-10-CM | POA: Diagnosis not present

## 2018-07-27 DIAGNOSIS — E7800 Pure hypercholesterolemia, unspecified: Secondary | ICD-10-CM | POA: Diagnosis not present

## 2018-07-27 DIAGNOSIS — E559 Vitamin D deficiency, unspecified: Secondary | ICD-10-CM | POA: Diagnosis not present

## 2018-07-27 DIAGNOSIS — I4891 Unspecified atrial fibrillation: Secondary | ICD-10-CM | POA: Diagnosis not present

## 2018-10-10 ENCOUNTER — Ambulatory Visit: Payer: Medicare HMO | Admitting: Cardiology

## 2018-10-18 DIAGNOSIS — Z1231 Encounter for screening mammogram for malignant neoplasm of breast: Secondary | ICD-10-CM | POA: Diagnosis not present

## 2018-10-18 DIAGNOSIS — R69 Illness, unspecified: Secondary | ICD-10-CM | POA: Diagnosis not present

## 2018-10-18 DIAGNOSIS — N959 Unspecified menopausal and perimenopausal disorder: Secondary | ICD-10-CM | POA: Diagnosis not present

## 2018-10-18 DIAGNOSIS — Z139 Encounter for screening, unspecified: Secondary | ICD-10-CM | POA: Diagnosis not present

## 2018-10-18 DIAGNOSIS — Z136 Encounter for screening for cardiovascular disorders: Secondary | ICD-10-CM | POA: Diagnosis not present

## 2018-10-18 DIAGNOSIS — E785 Hyperlipidemia, unspecified: Secondary | ICD-10-CM | POA: Diagnosis not present

## 2018-10-18 DIAGNOSIS — Z0000 Encounter for general adult medical examination without abnormal findings: Secondary | ICD-10-CM | POA: Diagnosis not present

## 2018-10-18 DIAGNOSIS — Z9181 History of falling: Secondary | ICD-10-CM | POA: Diagnosis not present

## 2018-10-18 DIAGNOSIS — Z6837 Body mass index (BMI) 37.0-37.9, adult: Secondary | ICD-10-CM | POA: Diagnosis not present

## 2018-10-18 DIAGNOSIS — Z1331 Encounter for screening for depression: Secondary | ICD-10-CM | POA: Diagnosis not present

## 2018-10-18 DIAGNOSIS — E669 Obesity, unspecified: Secondary | ICD-10-CM | POA: Diagnosis not present

## 2018-11-11 ENCOUNTER — Other Ambulatory Visit: Payer: Self-pay | Admitting: Cardiology

## 2018-12-11 ENCOUNTER — Ambulatory Visit: Payer: Medicare HMO | Admitting: Cardiology

## 2018-12-12 ENCOUNTER — Ambulatory Visit: Payer: Medicare HMO | Admitting: Cardiology

## 2018-12-19 DIAGNOSIS — M5416 Radiculopathy, lumbar region: Secondary | ICD-10-CM | POA: Diagnosis not present

## 2018-12-19 DIAGNOSIS — R350 Frequency of micturition: Secondary | ICD-10-CM | POA: Diagnosis not present

## 2019-01-03 NOTE — Progress Notes (Signed)
Cardiology Office Note:    Date:  01/04/2019   ID:  Robin Salazar, DOB 01-13-1941, MRN [id number]  PCP:  Nicoletta Dress, MD  Cardiologist:  Shirlee More, MD    Referring MD: Nicoletta Dress, MD    ASSESSMENT:    1. Chronic diastolic heart failure ([place])   2. Hypertensive heart disease with heart failure (HCC)   3. Persistent atrial fibrillation ([place])   4. Chronic anticoagulation   5. Hyperlipidemia, unspecified hyperlipidemia type    PLAN:    In order of problems listed above:  1. Stable compensated very well managed and supervised at home and continue her current loop diuretic sodium restriction and home self-management with daily weights.  I encouraged her to record her blood pressure several times a week and bring it to her next visit 2. Stable BP at target continue treatment including beta-blocker ARB 3. Stable rate controlled continue beta-blocker and direct anticoagulant 4. Continue her anticoagulant she has had no bleeding complication 5. Continue her intermediate intensity statin.  She is overdue for labs are drawn in my office today copy to her PCP including CMP proBNP lipid   Next appointment: 6 months video if we have frequent COVID-19 still   Medication Adjustments/Labs and Tests Ordered: Current medicines are reviewed at length with the patient today.  Concerns regarding medicines are outlined above.  No orders of the defined types were placed in this encounter.  No orders of the defined types were placed in this encounter.   Chief Complaint  Patient presents with  . Follow-up  . Congestive Heart Failure  . Atrial Fibrillation    History of Present Illness:    Robin Salazar is a 79 y.o. female with a hx of Diastolic CHF, persitent Atrial Fibrillation/atypical atrial flutter, HTN, Hyperlipidemia  last seen 07/11/2018. Compliance with diet, lifestyle and medications: Yes, she is supervised by daughter blood pressure heart rate stable weight unchanged   She is well supervised by her daughter and is doing well she has had no edema orthopnea shortness of breath chest pain palpitation or syncope.  She is compliant with her statin without muscle pain or weakness.  She is compliant with her anticoagulant without bleeding.  She is pleased with the quality of her life.  She had a visit last week with her PCP and is taking a steroid Dosepak for back pain Past Medical History:  Diagnosis Date  . Allergy   . Arthritis   . Asthma    anoro - inhaler  . Cataract    left eye  . CHF (congestive heart failure) ([place])   . Chronic anticoagulation 09/09/2015  . Clotting disorder (HCC)    anticoagulant - a fib  . COPD (chronic obstructive pulmonary disease) ([place])   . Hyperlipidemia 11/03/2016  . Hypertension   . Hypertensive heart disease with heart failure ([place]) 09/09/2015  . Persistent atrial fibrillation 09/09/2015    Past Surgical History:  Procedure Laterality Date  . ABDOMINAL HYSTERECTOMY    . APPENDECTOMY    . CHOLECYSTECTOMY    . COLONOSCOPY    . TONSILLECTOMY      Current Medications: Current Meds  Medication Sig  . albuterol (PROAIR HFA) 108 (90 Base) MCG/ACT inhaler Inhale 1 puff into the lungs every 6 (six) hours as needed for wheezing or shortness of breath.  Jearl Klinefelter ELLIPTA 62.5-25 MCG/INH AEPB TAKE 1 PUFF BY MOUTH EVERY DAY  . apixaban (ELIQUIS) 5 MG TABS tablet Take 5 mg by mouth 2 (two) times  daily.  Marland Kitchen. CALCIUM PO Take 1 tablet by mouth daily.  . cetirizine (ZYRTEC ALLERGY) 10 MG tablet Take 10 mg by mouth daily.  . DOCOSAHEXAENOIC ACID PO Take 2 g by mouth daily.  Marland Kitchen. donepezil (ARICEPT) 10 MG tablet Take 10 mg by mouth daily.  . furosemide (LASIX) 20 MG tablet TAKE 2 TABLETS BY MOUTH EVERY DAY  . KLOR-CON M20 20 MEQ tablet Take 20 mEq by mouth 2 (two) times daily.  . metoprolol tartrate (LOPRESSOR) 25 MG tablet 2 tablets in the Am and 1 tablet in the pm  . Multiple Vitamin (MULTI-VITAMINS) TABS Take 1 tablet by mouth daily.  .  simvastatin (ZOCOR) 40 MG tablet Take 40 mg by mouth daily.  Marland Kitchen. telmisartan (MICARDIS) 80 MG tablet Take 80 mg by mouth daily.  . Vitamin D, Ergocalciferol, (DRISDOL) 50000 units CAPS capsule Take 1 capsule by mouth once a week.   Current Facility-Administered Medications for the 01/04/19 encounter (Office Visit) with Baldo DaubMunley, Brian J, MD  Medication  . 0.9 %  sodium chloride infusion     Allergies:   Contrast media [iodinated diagnostic agents]   Social History   Socioeconomic History  . Marital status: Married    Spouse name: Not on file  . Number of children: Not on file  . Years of education: Not on file  . Highest education level: Not on file  Occupational History  . Not on file  Social Needs  . Financial resource strain: Not on file  . Food insecurity    Worry: Not on file    Inability: Not on file  . Transportation needs    Medical: Not on file    Non-medical: Not on file  Tobacco Use  . Smoking status: Never Smoker  . Smokeless tobacco: Never Used  Substance and Sexual Activity  . Alcohol use: No  . Drug use: No  . Sexual activity: Not Currently  Lifestyle  . Physical activity    Days per week: Not on file    Minutes per session: Not on file  . Stress: Not on file  Relationships  . Social Musicianconnections    Talks on phone: Not on file    Gets together: Not on file    Attends religious service: Not on file    Active member of club or organization: Not on file    Attends meetings of clubs or organizations: Not on file    Relationship status: Not on file  Other Topics Concern  . Not on file  Social History Narrative  . Not on file     Family History: The patient's family history includes Diabetes in her father; Heart disease in her father and mother. There is no history of Colon cancer, Esophageal cancer, Rectal cancer, or Stomach cancer. ROS:   Please see the history of present illness.    All other systems reviewed and are negative.  EKGs/Labs/Other  Studies Reviewed:    The following studies were reviewed today:  EKG:  EKG ordered today and personally reviewed.  The ekg ordered today demonstrates atrial fibrillation controlled ventricular rate  Recent Labs: 07/27/2018 Cholesterol 149 HDL 58 LDL 61 A1c 5.0% creatinine 0.99  Physical Exam:    VS:  BP 138/82 (BP Location: Right Arm, Patient Position: Sitting, Cuff Size: Large)   Pulse 70   Ht 5\' 3"  (1.6 m)   Wt 190 lb 6.4 oz (86.4 kg)   SpO2 97%   BMI 33.73 kg/m     Wt Readings  from Last 3 Encounters:  01/04/19 190 lb 6.4 oz (86.4 kg)  07/11/18 191 lb (86.6 kg)  11/15/17 195 lb 9.6 oz (88.7 kg)     GEN:  Well nourished, well developed in no acute distress HEENT: Normal NECK: No JVD; No carotid bruits LYMPHATICS: No lymphadenopathy CARDIAC: Irregular variable first heart sound RRR, no rubs, gallops RESPIRATORY:  Clear to auscultation without rales, wheezing or rhonchi  ABDOMEN: Soft, non-tender, non-distended MUSCULOSKELETAL:  No edema; No deformity  SKIN: Warm and dry NEUROLOGIC:  Alert and oriented x 3 PSYCHIATRIC:  Normal affect    Signed, Norman Herrlich, MD  01/04/2019 11:25 AM    [facility]

## 2019-01-04 ENCOUNTER — Ambulatory Visit (INDEPENDENT_AMBULATORY_CARE_PROVIDER_SITE_OTHER): Payer: Medicare HMO | Admitting: Cardiology

## 2019-01-04 ENCOUNTER — Other Ambulatory Visit: Payer: Self-pay

## 2019-01-04 VITALS — BP 138/82 | HR 70 | Ht 63.0 in | Wt 190.4 lb

## 2019-01-04 DIAGNOSIS — E785 Hyperlipidemia, unspecified: Secondary | ICD-10-CM

## 2019-01-04 DIAGNOSIS — I4819 Other persistent atrial fibrillation: Secondary | ICD-10-CM

## 2019-01-04 DIAGNOSIS — I110 Hypertensive heart disease with heart failure: Secondary | ICD-10-CM

## 2019-01-04 DIAGNOSIS — I5032 Chronic diastolic (congestive) heart failure: Secondary | ICD-10-CM | POA: Diagnosis not present

## 2019-01-04 DIAGNOSIS — Z7901 Long term (current) use of anticoagulants: Secondary | ICD-10-CM

## 2019-01-04 NOTE — Addendum Note (Signed)
Addended by: Austin Miles on: 01/04/2019 11:38 AM   Modules accepted: Orders

## 2019-01-04 NOTE — Patient Instructions (Signed)
Medication Instructions:  Your physician recommends that you continue on your current medications as directed. Please refer to the Current Medication list given to you today.  *If you need a refill on your cardiac medications before your next appointment, please call your pharmacy*  Lab Work: Your physician recommends that you return for lab work today: lipid panel, CMP, ProBNP.   If you have labs (blood work) drawn today and your tests are completely normal, you will receive your results only by: . MyChart Message (if you have MyChart) OR . A paper copy in the mail If you have any lab test that is abnormal or we need to change your treatment, we will call you to review the results.  Testing/Procedures: You had an EKG today.   Follow-Up: At [facility], you and your health needs are our priority.  As part of our continuing mission to provide you with exceptional heart care, we have created designated Provider Care Teams.  These Care Teams include your primary Cardiologist (physician) and Advanced Practice Providers (APPs -  Physician Assistants and Nurse Practitioners) who all work together to provide you with the care you need, when you need it.  Your next appointment:   6 months  The format for your next appointment:   In Person  Provider:   Brian Munley, MD    

## 2019-01-05 LAB — COMPREHENSIVE METABOLIC PANEL
ALT: 11 IU/L (ref 0–32)
AST: 23 IU/L (ref 0–40)
Albumin/Globulin Ratio: 1.7 (ref 1.2–2.2)
Albumin: 4.1 g/dL (ref 3.7–4.7)
Alkaline Phosphatase: 63 IU/L (ref 39–117)
BUN/Creatinine Ratio: 17 (ref 12–28)
BUN: 20 mg/dL (ref 8–27)
Bilirubin Total: 0.6 mg/dL (ref 0.0–1.2)
CO2: 25 mmol/L (ref 20–29)
Calcium: 9.4 mg/dL (ref 8.7–10.3)
Chloride: 102 mmol/L (ref 96–106)
Creatinine, Ser: 1.17 mg/dL — ABNORMAL HIGH (ref 0.57–1.00)
GFR calc Af Amer: 52 mL/min/{1.73_m2} — ABNORMAL LOW (ref >59)
GFR calc non Af Amer: 45 mL/min/{1.73_m2} — ABNORMAL LOW (ref >59)
Globulin, Total: 2.4 g/dL (ref 1.5–4.5)
Glucose: 86 mg/dL (ref 65–99)
Potassium: 5.4 mmol/L — ABNORMAL HIGH (ref 3.5–5.2)
Sodium: 144 mmol/L (ref 134–144)
Total Protein: 6.5 g/dL (ref 6.0–8.5)

## 2019-01-05 LAB — LIPID PANEL
Chol/HDL Ratio: 2.4 ratio (ref 0.0–4.4)
Cholesterol, Total: 169 mg/dL (ref 100–199)
HDL: 71 mg/dL (ref >39)
LDL Chol Calc (NIH): 80 mg/dL (ref 0–99)
Triglycerides: 102 mg/dL (ref 0–149)
VLDL Cholesterol Cal: 18 mg/dL (ref 5–40)

## 2019-01-05 LAB — PRO B NATRIURETIC PEPTIDE: NT-Pro BNP: 4123 pg/mL — ABNORMAL HIGH (ref 0–738)

## 2019-01-10 IMAGING — NM NM CHEST EXAM
4 series · 24 of 24 positions shown · non-contrast
Comparison: none

[Series 1: stress · 6.40mm/px · 6 of 64 frames shown]
[frame 6/64]
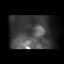
[frame 16/64]
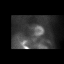
[frame 27/64]
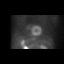
[frame 38/64]
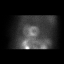
[frame 48/64]
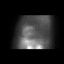
[frame 59/64]
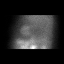

[Series 1: wbr_r-proj_st rest · 6.40mm/px · 6 of 64 frames shown]
[frame 6/64]
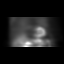
[frame 16/64]
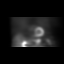
[frame 27/64]
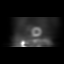
[frame 38/64]
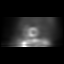
[frame 48/64]
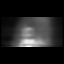
[frame 59/64]
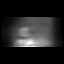

[Series 1: rest · 6.40mm/px · 6 of 64 frames shown]
[frame 6/64]
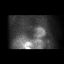
[frame 16/64]
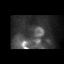
[frame 27/64]
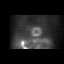
[frame 38/64]
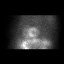
[frame 48/64]
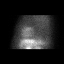
[frame 59/64]
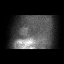

[Series 1: wbr_s-proj_st stress · 6.40mm/px · 6 of 64 frames shown]
[frame 6/64]
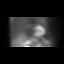
[frame 16/64]
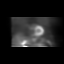
[frame 27/64]
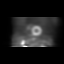
[frame 38/64]
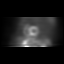
[frame 48/64]
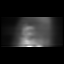
[frame 59/64]
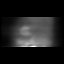

[24 of 24 positions shown; findings below may reference images not displayed]

Canned report from images found in remote index.

Refer to host system for actual result text.

## 2019-02-01 DIAGNOSIS — Z1231 Encounter for screening mammogram for malignant neoplasm of breast: Secondary | ICD-10-CM | POA: Diagnosis not present

## 2019-02-01 DIAGNOSIS — N959 Unspecified menopausal and perimenopausal disorder: Secondary | ICD-10-CM | POA: Diagnosis not present

## 2019-02-01 DIAGNOSIS — M8589 Other specified disorders of bone density and structure, multiple sites: Secondary | ICD-10-CM | POA: Diagnosis not present

## 2019-02-06 ENCOUNTER — Other Ambulatory Visit: Payer: Self-pay | Admitting: Cardiology

## 2019-03-31 DIAGNOSIS — R0789 Other chest pain: Secondary | ICD-10-CM | POA: Diagnosis not present

## 2019-03-31 DIAGNOSIS — R1084 Generalized abdominal pain: Secondary | ICD-10-CM | POA: Diagnosis not present

## 2019-03-31 DIAGNOSIS — K5790 Diverticulosis of intestine, part unspecified, without perforation or abscess without bleeding: Secondary | ICD-10-CM | POA: Diagnosis not present

## 2019-03-31 DIAGNOSIS — I4891 Unspecified atrial fibrillation: Secondary | ICD-10-CM | POA: Diagnosis not present

## 2019-03-31 DIAGNOSIS — R079 Chest pain, unspecified: Secondary | ICD-10-CM | POA: Diagnosis not present

## 2019-03-31 DIAGNOSIS — R110 Nausea: Secondary | ICD-10-CM | POA: Diagnosis not present

## 2019-03-31 DIAGNOSIS — R1010 Upper abdominal pain, unspecified: Secondary | ICD-10-CM | POA: Diagnosis not present

## 2019-03-31 DIAGNOSIS — R404 Transient alteration of awareness: Secondary | ICD-10-CM | POA: Diagnosis not present

## 2019-04-08 ENCOUNTER — Telehealth: Payer: Self-pay

## 2019-04-08 ENCOUNTER — Other Ambulatory Visit: Payer: Self-pay

## 2019-04-08 ENCOUNTER — Ambulatory Visit: Payer: Medicare HMO | Admitting: Gastroenterology

## 2019-04-08 ENCOUNTER — Encounter: Payer: Self-pay | Admitting: Gastroenterology

## 2019-04-08 VITALS — BP 142/74 | HR 87 | Temp 97.7°F | Ht 62.0 in | Wt 186.0 lb

## 2019-04-08 DIAGNOSIS — R112 Nausea with vomiting, unspecified: Secondary | ICD-10-CM

## 2019-04-08 DIAGNOSIS — R1013 Epigastric pain: Secondary | ICD-10-CM | POA: Diagnosis not present

## 2019-04-08 DIAGNOSIS — K5909 Other constipation: Secondary | ICD-10-CM

## 2019-04-08 DIAGNOSIS — Z01818 Encounter for other preprocedural examination: Secondary | ICD-10-CM | POA: Diagnosis not present

## 2019-04-08 MED ORDER — OMEPRAZOLE 40 MG PO CPDR: 40.0000 mg | DELAYED_RELEASE_CAPSULE | Freq: Every day | ORAL | 11 refills | Status: DC

## 2019-04-08 MED ORDER — FAMOTIDINE 20 MG PO TABS: 20.0000 mg | ORAL_TABLET | Freq: Every day | ORAL | 11 refills | Status: AC

## 2019-04-08 NOTE — Progress Notes (Signed)
Chief Complaint: FU  Referring Provider:  Nicoletta Dress, MD      ASSESSMENT AND PLAN;   #1. N/Vepi pain. Neg NCCT 03/31/2019. Nl CBC, CMP. H/O HP gastritis on EGD 10/2017   #2. Chronic constipation. Neg colon 06/2011 except for pancolonic div. Neg random colonic Bx.  #3.  Comorbid conditions include Afib on Eliquis, dCHF (EF 60-65%, 2017), HTN, HLD, early dementia, COPD  Plan: - EGD off Eliquis x 2 days before - Restart omeprazole 40mg  po qd, #30 - Take Pepcid 20mg  po qhs - Start taking stool softeners 1/day   HPI:    Robin Salazar is a 79 y.o. female  Seen as an emergency workin FU from ED visit 03/31/2019 at [facility] with epigastric pain, associated nausea/vomiting. Previously had negative cardiac work-up Thought to be GI in origin Underwent noncontrast CT Abdo/pelvis (due to questionable allergy to IV contrast) which was unremarkable.  She had normal CBC, CMP and lipase.  Treated symptomatically with Carafate and sent to GI clinic for further evaluation.  Additional data-chest x-ray was unremarkable on 03/31/2019.  She had negative stress test previously.  She does complain of occasional constipation.  No melena or hematochezia.  Previous GI procedures:  NCCT AP 03/31/2019 -Small hiatal hernia -Diverticulosis without diverticulitis -No acute abnormalities.  Colonoscopy 06/2011: Pancolonic diverticulosis, neg random colonic biopsies for microscopic colitis.  Otherwise normal to TI.  EGD 10/2017 -Erosive HP gastritis.  Treated with triple drug therapy. -Duodenitis. Past Medical History:  Diagnosis Date  . Allergy   . Arthritis   . Asthma    anoro - inhaler  . Cataract    left eye  . CHF (congestive heart failure) ([place])   . Chronic anticoagulation 09/09/2015  . Clotting disorder (HCC)    anticoagulant - a fib  . COPD (chronic obstructive pulmonary disease) ([place])   . History of Helicobacter pylori infection   . Hyperlipidemia 11/03/2016  . Hypertension   .  Hypertensive heart disease with heart failure ([place]) 09/09/2015  . Persistent atrial fibrillation ([place]) 09/09/2015    Past Surgical History:  Procedure Laterality Date  . ABDOMINAL HYSTERECTOMY    . APPENDECTOMY    . CHOLECYSTECTOMY    . COLONOSCOPY    . TONSILLECTOMY      Family History  Problem Relation Age of Onset  . Heart disease Mother   . Heart disease Father   . Diabetes Father   . Colon cancer Neg Hx   . Esophageal cancer Neg Hx   . Rectal cancer Neg Hx   . Stomach cancer Neg Hx     Social History   Tobacco Use  . Smoking status: Never Smoker  . Smokeless tobacco: Never Used  Substance Use Topics  . Alcohol use: No  . Drug use: No    Current Outpatient Medications  Medication Sig Dispense Refill  . albuterol (PROAIR HFA) 108 (90 Base) MCG/ACT inhaler Inhale 1 puff into the lungs every 6 (six) hours as needed for wheezing or shortness of breath.    Jearl Klinefelter ELLIPTA 62.5-25 MCG/INH AEPB TAKE 1 PUFF BY MOUTH EVERY DAY  3  . apixaban (ELIQUIS) 5 MG TABS tablet Take 5 mg by mouth 2 (two) times daily.    Marland Kitchen CALCIUM PO Take 1 tablet by mouth daily.    . cetirizine (ZYRTEC ALLERGY) 10 MG tablet Take 10 mg by mouth daily.    Marland Kitchen donepezil (ARICEPT) 10 MG tablet Take 10 mg by mouth daily.    . famotidine (  PEPCID) 20 MG tablet Take 20 mg by mouth daily.    . furosemide (LASIX) 20 MG tablet TAKE 2 TABLETS BY MOUTH EVERY DAY 180 tablet 1  . KLOR-CON M20 20 MEQ tablet Take 20 mEq by mouth 2 (two) times daily.  1  . metoprolol tartrate (LOPRESSOR) 25 MG tablet 2 tablets in the Am and 1 tablet in the pm    . Multiple Vitamin (MULTI-VITAMINS) TABS Take 1 tablet by mouth daily.    . Omega-3 Fatty Acids (FISH OIL PO) Take 1 tablet by mouth daily.    . simvastatin (ZOCOR) 40 MG tablet Take 40 mg by mouth daily.  1  . sucralfate (CARAFATE) 1 g tablet 1 tablet as directed. Take before each meal    . telmisartan (MICARDIS) 80 MG tablet Take 80 mg by mouth daily.  1  . Vitamin D,  Ergocalciferol, (DRISDOL) 50000 units CAPS capsule Take 1 capsule by mouth once a week.     Current Facility-Administered Medications  Medication Dose Route Frequency Provider Last Rate Last Admin  . 0.9 %  sodium chloride infusion  500 mL Intravenous Once Cirigliano, Vito V, DO        Allergies  Allergen Reactions  . Contrast Media [Iodinated Diagnostic Agents]     Does not remember reaction    Review of Systems:  neg     Physical Exam:    BP (!) 142/74   Pulse 87   Temp 97.7 F (36.5 C)   Ht 5\' 2"  (1.575 m)   Wt 186 lb (84.4 kg)   BMI 34.02 kg/m  Wt Readings from Last 3 Encounters:  04/08/19 186 lb (84.4 kg)  01/04/19 190 lb 6.4 oz (86.4 kg)  07/11/18 191 lb (86.6 kg)   Constitutional:  Well-developed, in no acute distress. Psychiatric: Normal mood and affect. Behavior is normal. HEENT: Pupils normal.  Conjunctivae are normal. No scleral icterus. Neck supple.  Cardiovascular: Normal rate, regular rhythm. No edema Pulmonary/chest: Effort normal and breath sounds normal. No wheezing, rales or rhonchi. Abdominal: Soft, nondistended.  Mild epigastric tenderness bowel sounds active throughout. There are no masses palpable. No hepatomegaly. Rectal:  defered Neurological: Alert and oriented to person place and time. Skin: Skin is warm and dry. No rashes noted.  Data Reviewed: I have personally reviewed following labs and imaging studies  CBC: No flowsheet data found.  CMP: CMP Latest Ref Rng & Units 01/04/2019  Glucose 65 - 99 mg/dL 86  BUN 8 - 27 mg/dL 20  Creatinine 01/06/2019 - 7.68 mg/dL 0.88)  Sodium 1.10(R - 159 mmol/L 144  Potassium 3.5 - 5.2 mmol/L 5.4(H)  Chloride 96 - 106 mmol/L 102  CO2 20 - 29 mmol/L 25  Calcium 8.7 - 10.3 mg/dL 9.4  Total Protein 6.0 - 8.5 g/dL 6.5  Total Bilirubin 0.0 - 1.2 mg/dL 0.6  Alkaline Phos 39 - 117 IU/L 63  AST 0 - 40 IU/L 23  ALT 0 - 32 IU/L 11   Labs from 03/31/2019-[facility]  -CBC with hemoglobin 13.9, MCV 100,  platelets 276, WBC count 6.3. -UA unremarkable -CMP normal except for CO2 34.  Normal LFTs with AST 34, ALT 17. -Lipase normal at 84 -Normal PT INR  Discussed in detail with the patient and patient's daughter.   04/02/2019, MD 04/08/2019, 4:19 PM  Cc: 04/10/2019, MD

## 2019-04-08 NOTE — Patient Instructions (Signed)
If you are age 79 or older, your body mass index should be between 23-30. Your Body mass index is 34.02 kg/m. If this is out of the aforementioned range listed, please consider follow up with your Primary Care Provider.  If you are age 58 or younger, your body mass index should be between 19-25. Your Body mass index is 34.02 kg/m. If this is out of the aformentioned range listed, please consider follow up with your Primary Care Provider.   We have sent the following medications to your pharmacy for you to pick up at your convenience: Omeprazole Pepcid  You have been scheduled for an endoscopy. Please follow written instructions given to you at your visit today. If you use inhalers (even only as needed), please bring them with you on the day of your procedure. Your physician has requested that you go to www.startemmi.com and enter the access code given to you at your visit today. This web site gives a general overview about your procedure. However, you should still follow specific instructions given to you by our office regarding your preparation for the procedure.  You will be contacted by our office prior to your procedure for directions on holding your Eliquis.  If you do not hear from our office tomorrow morning, please call [phone] to discuss.    Thank you,  Dr. Lynann Bologna

## 2019-04-08 NOTE — Telephone Encounter (Signed)
Pharm. Please address Eliquis  Thanks.

## 2019-04-08 NOTE — Telephone Encounter (Signed)
[facility] Pre-operative Risk Assessment     Request for surgical clearance:     Endoscopy Procedure  What type of surgery is being performed?     EGD  When is this surgery scheduled?     04/12/19  What type of clearance is required ?   Pharmacy  Are there any medications that need to be held prior to surgery and how long? Eliquis x 2 days   Practice name and name of physician performing surgery?      [facility]  What is your office phone and fax number?      Phone- [phone]  [phone]  Anesthesia type (None, local, MAC, general) ?       MAC

## 2019-04-09 ENCOUNTER — Encounter: Payer: Self-pay | Admitting: Gastroenterology

## 2019-04-09 NOTE — Telephone Encounter (Signed)
Pt takes Eliquis for afib with CHADS2VASc score of 5 (age x2, sex, CHF, HTN). CrCl 45mL/min. Ok to hold Eliquis for 2 days prior to protocol as requested.

## 2019-04-10 ENCOUNTER — Ambulatory Visit (INDEPENDENT_AMBULATORY_CARE_PROVIDER_SITE_OTHER): Payer: Medicare HMO

## 2019-04-10 ENCOUNTER — Other Ambulatory Visit: Payer: Self-pay

## 2019-04-10 DIAGNOSIS — Z1159 Encounter for screening for other viral diseases: Secondary | ICD-10-CM | POA: Diagnosis not present

## 2019-04-11 LAB — SARS CORONAVIRUS 2 (TAT 6-24 HRS): SARS Coronavirus 2: NEGATIVE

## 2019-04-12 ENCOUNTER — Other Ambulatory Visit: Payer: Self-pay

## 2019-04-12 ENCOUNTER — Ambulatory Visit (AMBULATORY_SURGERY_CENTER): Payer: Medicare HMO | Admitting: Gastroenterology

## 2019-04-12 ENCOUNTER — Encounter: Payer: Self-pay | Admitting: Gastroenterology

## 2019-04-12 VITALS — BP 142/78 | HR 110 | Temp 97.5°F | Resp 18 | Ht 62.0 in | Wt 186.0 lb

## 2019-04-12 DIAGNOSIS — K2950 Unspecified chronic gastritis without bleeding: Secondary | ICD-10-CM | POA: Diagnosis not present

## 2019-04-12 DIAGNOSIS — R112 Nausea with vomiting, unspecified: Secondary | ICD-10-CM

## 2019-04-12 MED ORDER — SODIUM CHLORIDE 0.9 % IV SOLN: 500.0000 mL | Freq: Once | INTRAVENOUS | Status: DC

## 2019-04-12 NOTE — Progress Notes (Signed)
Instructed pt to take inhalers before procedure start.tb

## 2019-04-12 NOTE — Patient Instructions (Signed)
Handout on gastritis given to you today  Await pathology results  Resume eliquis tomorrow as normal    YOU HAD AN ENDOSCOPIC PROCEDURE TODAY AT THE [facility]:   Refer to the procedure report that was given to you for any specific questions about what was found during the examination.  If the procedure report does not answer your questions, please call your gastroenterologist to clarify.  If you requested that your care partner not be given the details of your procedure findings, then the procedure report has been included in a sealed envelope for you to review at your convenience later.  YOU SHOULD EXPECT: Some feelings of bloating in the abdomen. Passage of more gas than usual.  Walking can help get rid of the air that was put into your GI tract during the procedure and reduce the bloating. If you had a lower endoscopy (such as a colonoscopy or flexible sigmoidoscopy) you may notice spotting of blood in your stool or on the toilet paper. If you underwent a bowel prep for your procedure, you may not have a normal bowel movement for a few days.  Please Note:  You might notice some irritation and congestion in your nose or some drainage.  This is from the oxygen used during your procedure.  There is no need for concern and it should clear up in a day or so.  SYMPTOMS TO REPORT IMMEDIATELY:   Following upper endoscopy (EGD)  Vomiting of blood or coffee ground material  New chest pain or pain under the shoulder blades  Painful or persistently difficult swallowing  New shortness of breath  Fever of 100F or higher  Black, tarry-looking stools  For urgent or emergent issues, a gastroenterologist can be reached at any hour by calling [phone].   DIET:  We do recommend a small meal at first, but then you may proceed to your regular diet.  Drink plenty of fluids but you should avoid alcoholic beverages for 24 hours.  ACTIVITY:  You should plan to take it easy for the rest of  today and you should NOT DRIVE or use heavy machinery until tomorrow (because of the sedation medicines used during the test).    FOLLOW UP: Our staff will call the number listed on your records 48-72 hours following your procedure to check on you and address any questions or concerns that you may have regarding the information given to you following your procedure. If we do not reach you, we will leave a message.  We will attempt to reach you two times.  During this call, we will ask if you have developed any symptoms of COVID 19. If you develop any symptoms (ie: fever, flu-like symptoms, shortness of breath, cough etc.) before then, please call [phone].  If you test positive for Covid 19 in the 2 weeks post procedure, please call and report this information to [place].    If any biopsies were taken you will be contacted by phone or by letter within the next 1-3 weeks.  Please call us at [phone] if you have not heard about the biopsies in 3 weeks.    SIGNATURES/CONFIDENTIALITY: You and/or your care partner have signed paperwork which will be entered into your electronic medical record.  These signatures attest to the fact that that the information above on your After Visit Summary has been reviewed and is understood.  Full responsibility of the confidentiality of this discharge information lies with you and/or your care-partner.

## 2019-04-12 NOTE — Op Note (Signed)
[facility] Patient Name: Robin Salazar Procedure Date: 04/12/2019 7:59 AM MRN: [id number] Endoscopist: Lynann Bologna , MD Age: 79 Referring MD:  Date of Birth: 07/30/1940 Gender: Female Account #: [id number] Procedure:                Upper GI endoscopy Indications:              Epigastric abdominal pain. H/O N/V Medicines:                Monitored Anesthesia Care Procedure:                Pre-Anesthesia Assessment:                           - Prior to the procedure, a History and Physical                            was performed, and patient medications and                            allergies were reviewed. The patient's tolerance of                            previous anesthesia was also reviewed. The risks                            and benefits of the procedure and the sedation                            options and risks were discussed with the patient.                            All questions were answered, and informed consent                            was obtained. Prior Anticoagulants: The patient on                            Eliquis held 2 days before. ASA Grade Assessment:                            III - A patient with severe systemic disease. After                            reviewing the risks and benefits, the patient was                            deemed in satisfactory condition to undergo the                            procedure.                           After obtaining informed consent, the endoscope was  passed under direct vision. Throughout the                            procedure, the patient's blood pressure, pulse, and                            oxygen saturations were monitored continuously. The                            Endoscope was introduced through the mouth, and                            advanced to the second part of duodenum. The upper                            GI endoscopy was accomplished without difficulty.                             The patient tolerated the procedure well. Scope In: Scope Out: Findings:                 The examined esophagus was normal. Z-line                            well-defined at 35 cm. Examined by NBI.                           Localized minimal inflammation characterized by                            erythema was found in the gastric antrum. Biopsies                            were taken with a cold forceps for histology.                           The examined duodenum was normal. Complications:            No immediate complications. Estimated Blood Loss:     Estimated blood loss: none. Impression:               -Minimal gastritis. Recommendation:           - Resume previous diet.                           - Continue present medications.                           - Resume Eliquis (apixaban) at prior dose tomorrow.                           - Await pathology results.                           - The findings and recommendations were discussed  with the patient's family.                           - Return to GI clinic in 12 weeks. Jackquline Denmark, MD 04/12/2019 8:20:03 AM This report has been signed electronically.

## 2019-04-12 NOTE — Progress Notes (Signed)
Called to room to assist during endoscopic procedure.  Patient ID and intended procedure confirmed with present staff. Received instructions for my participation in the procedure from the performing physician.  

## 2019-04-12 NOTE — Progress Notes (Signed)
To PACU, VSS. Report to RN.tb 

## 2019-04-12 NOTE — Progress Notes (Signed)
Pt's states no medical or surgical changes since previsit or office visit.  JB - temp KA - vitals 

## 2019-04-16 ENCOUNTER — Telehealth: Payer: Self-pay

## 2019-04-16 NOTE — Telephone Encounter (Signed)
  Follow up Call-  Call back number 04/12/2019 10/18/2017  Post procedure Call Back phone  # [phone] #[phone] cell  Permission to leave phone message Yes Yes  Some recent data might be hidden     Patient questions:  Do you have a fever, pain , or abdominal swelling? No. Pain Score  0 *  Have you tolerated food without any problems? Yes.    Have you been able to return to your normal activities? Yes.    Do you have any questions about your discharge instructions: Diet   No. Medications  No. Follow up visit  No.  Do you have questions or concerns about your Care? No.  Actions: * If pain score is 4 or above: No action needed, pain <4. 1. Have you developed a fever since your procedure? no  2.   Have you had an respiratory symptoms (SOB or cough) since your procedure? no  3.   Have you tested positive for COVID 19 since your procedure no  4.   Have you had any family members/close contacts diagnosed with the COVID 19 since your procedure?  no   If yes to any of these questions please route to Laverna Peace, RN and Jennye Boroughs, Charity fundraiser.

## 2019-04-17 ENCOUNTER — Encounter: Payer: Self-pay | Admitting: Gastroenterology

## 2019-04-17 DIAGNOSIS — E538 Deficiency of other specified B group vitamins: Secondary | ICD-10-CM | POA: Diagnosis not present

## 2019-04-17 DIAGNOSIS — E7800 Pure hypercholesterolemia, unspecified: Secondary | ICD-10-CM | POA: Diagnosis not present

## 2019-04-17 DIAGNOSIS — E559 Vitamin D deficiency, unspecified: Secondary | ICD-10-CM | POA: Diagnosis not present

## 2019-04-17 DIAGNOSIS — I4891 Unspecified atrial fibrillation: Secondary | ICD-10-CM | POA: Diagnosis not present

## 2019-04-17 DIAGNOSIS — I130 Hypertensive heart and chronic kidney disease with heart failure and stage 1 through stage 4 chronic kidney disease, or unspecified chronic kidney disease: Secondary | ICD-10-CM | POA: Diagnosis not present

## 2019-04-17 DIAGNOSIS — J449 Chronic obstructive pulmonary disease, unspecified: Secondary | ICD-10-CM | POA: Diagnosis not present

## 2019-04-17 DIAGNOSIS — R69 Illness, unspecified: Secondary | ICD-10-CM | POA: Diagnosis not present

## 2019-04-17 DIAGNOSIS — N183 Chronic kidney disease, stage 3 unspecified: Secondary | ICD-10-CM | POA: Diagnosis not present

## 2019-05-16 ENCOUNTER — Telehealth: Payer: Self-pay | Admitting: Gastroenterology

## 2019-05-16 NOTE — Telephone Encounter (Signed)
Patient's daughter called to see if her mother should still be taking the medications or if she needs to stop them please advise

## 2019-05-20 NOTE — Telephone Encounter (Signed)
Left message for pt's daughter to call back.

## 2019-05-23 NOTE — Telephone Encounter (Signed)
Patient's daughter states she no longer has questions.  She has an appt with Dr. Chales Abrahams upcoming

## 2019-06-20 ENCOUNTER — Ambulatory Visit: Payer: Medicare HMO | Admitting: Gastroenterology

## 2019-06-20 ENCOUNTER — Encounter: Payer: Self-pay | Admitting: Gastroenterology

## 2019-06-20 ENCOUNTER — Other Ambulatory Visit: Payer: Self-pay

## 2019-06-20 VITALS — BP 140/82 | HR 85 | Temp 97.1°F | Ht 62.0 in | Wt 187.4 lb

## 2019-06-20 DIAGNOSIS — K5909 Other constipation: Secondary | ICD-10-CM | POA: Diagnosis not present

## 2019-06-20 DIAGNOSIS — R1013 Epigastric pain: Secondary | ICD-10-CM

## 2019-06-20 DIAGNOSIS — R112 Nausea with vomiting, unspecified: Secondary | ICD-10-CM

## 2019-06-20 MED ORDER — OMEPRAZOLE 40 MG PO CPDR: 40.0000 mg | DELAYED_RELEASE_CAPSULE | Freq: Every day | ORAL | 11 refills | Status: AC

## 2019-06-20 NOTE — Progress Notes (Signed)
Chief Complaint: FU  Referring Provider:  Paulina Fusi, MD      ASSESSMENT AND PLAN;   #1. N/Vepi pain (resolved). Neg EGD with Bx 03/2019, neg NCCT 03/31/2019. Nl CBC, CMP. H/O HP gastritis 10/2017   #2. Chronic constipation. Neg colon 06/2011 except for pancolonic div. Neg random colonic Bx.  #3. Comorbid conditions include Afib on Eliquis, dCHF (EF 60-65%, 2017), HTN, HLD, early dementia, COPD  Plan: - Continue omeprazole 40mg  po qAM, #30 - Take Pepcid 20mg  po QD PRN - Continue stool softeners 1/day - Stop carafate. - FU PRN - D/W daughter   HPI:    Robin Salazar is a 79 y.o. female   For FU Feels better Had EGD 03/2019 -showing mild gastritis with negative H. Pylori. Rare epigastric pain Tolerating omeprazole well.  Patient and the daughter is pleased with the progress.  They do follow-up with Dr. 70 on regular basis.   From previous records: Underwent noncontrast CT Abdo/pelvis (due to questionable allergy to IV contrast) which was unremarkable.  She had normal CBC, CMP and lipase.  Treated symptomatically with Carafate and sent to GI clinic for further evaluation.  Additional data-chest x-ray was unremarkable on 03/31/2019.  She had negative stress test previously.  She does complain of occasional constipation.  No melena or hematochezia.  Previous GI procedures:  NCCT AP 03/31/2019 -Small hiatal hernia -Diverticulosis without diverticulitis -No acute abnormalities.  Colonoscopy 06/2011: Pancolonic diverticulosis, neg random colonic biopsies for microscopic colitis.  Otherwise normal to TI.  EGD 03/2019: Mild gastritis.  Otherwise negative EGD.  Negative biopsies.  See under procedure tab.   EGD 10/2017 -Erosive HP gastritis.  Treated with triple drug therapy. -Duodenitis. Past Medical History:  Diagnosis Date  . Allergy   . Arthritis   . Asthma    anoro - inhaler  . Cataract    left eye  . CHF (congestive heart failure) (HCC)   . Chronic  anticoagulation 09/09/2015  . Clotting disorder (HCC)    anticoagulant - a fib  . COPD (chronic obstructive pulmonary disease) (HCC)   . History of Helicobacter pylori infection   . Hyperlipidemia 11/03/2016  . Hypertension   . Hypertensive heart disease with heart failure (HCC) 09/09/2015  . Persistent atrial fibrillation (HCC) 09/09/2015    Past Surgical History:  Procedure Laterality Date  . ABDOMINAL HYSTERECTOMY    . APPENDECTOMY    . CHOLECYSTECTOMY    . COLONOSCOPY    . TONSILLECTOMY      Family History  Problem Relation Age of Onset  . Heart disease Mother   . Heart disease Father   . Diabetes Father   . Colon cancer Neg Hx   . Esophageal cancer Neg Hx   . Rectal cancer Neg Hx   . Stomach cancer Neg Hx     Social History   Tobacco Use  . Smoking status: Never Smoker  . Smokeless tobacco: Never Used  Substance Use Topics  . Alcohol use: No  . Drug use: No    Current Outpatient Medications  Medication Sig Dispense Refill  . albuterol (PROAIR HFA) 108 (90 Base) MCG/ACT inhaler Inhale 1 puff into the lungs every 6 (six) hours as needed for wheezing or shortness of breath.    09/11/2015 ELLIPTA 62.5-25 MCG/INH AEPB TAKE 1 PUFF BY MOUTH EVERY DAY  3  . apixaban (ELIQUIS) 5 MG TABS tablet Take 5 mg by mouth 2 (two) times daily.    Ailene Ards CALCIUM PO Take 1  tablet by mouth daily.    . cetirizine (ZYRTEC ALLERGY) 10 MG tablet Take 10 mg by mouth daily.    Marland Kitchen donepezil (ARICEPT) 10 MG tablet Take 10 mg by mouth daily.    . famotidine (PEPCID) 20 MG tablet Take 1 tablet (20 mg total) by mouth at bedtime. 30 tablet 11  . furosemide (LASIX) 20 MG tablet TAKE 2 TABLETS BY MOUTH EVERY DAY 180 tablet 1  . KLOR-CON M20 20 MEQ tablet Take 20 mEq by mouth 2 (two) times daily.  1  . memantine (NAMENDA) 5 MG tablet Take 5 mg by mouth 2 (two) times daily.    . metoprolol tartrate (LOPRESSOR) 25 MG tablet 2 tablets in the Am and 1 tablet in the pm    . Multiple Vitamin (MULTI-VITAMINS) TABS  Take 1 tablet by mouth daily.    . Omega-3 Fatty Acids (FISH OIL PO) Take 1 tablet by mouth daily.    Marland Kitchen omeprazole (PRILOSEC) 40 MG capsule Take 1 capsule (40 mg total) by mouth daily. 30 capsule 11  . simvastatin (ZOCOR) 40 MG tablet Take 40 mg by mouth daily.  1  . sucralfate (CARAFATE) 1 g tablet 1 tablet as directed. Take before each meal    . telmisartan (MICARDIS) 80 MG tablet Take 80 mg by mouth daily.  1  . Vitamin D, Ergocalciferol, (DRISDOL) 50000 units CAPS capsule Take 1 capsule by mouth once a week.     Current Facility-Administered Medications  Medication Dose Route Frequency Provider Last Rate Last Admin  . 0.9 %  sodium chloride infusion  500 mL Intravenous Once Cirigliano, Vito V, DO        Allergies  Allergen Reactions  . Contrast Media [Iodinated Diagnostic Agents]     Does not remember reaction    Review of Systems:  neg     Physical Exam:    Temp (!) 97.1 F (36.2 C)   Ht 5\' 2"  (1.575 m)   Wt 187 lb 6 oz (85 kg)   BMI 34.27 kg/m  Wt Readings from Last 3 Encounters:  06/20/19 187 lb 6 oz (85 kg)  04/12/19 186 lb (84.4 kg)  04/08/19 186 lb (84.4 kg)   Constitutional:  Well-developed, in no acute distress. Psychiatric: Normal mood and affect. Behavior is normal. Abdominal: Soft, nondistended.  Nontender.  Bowel sounds active throughout. There are no masses palpable. No hepatomegaly. Rectal:  defered Neurological: Alert and oriented to person place and time. Skin: Skin is warm and dry. No rashes noted.  Data Reviewed: I have personally reviewed following labs and imaging studies  CBC: No flowsheet data found.  CMP: CMP Latest Ref Rng & Units 01/04/2019  Glucose 65 - 99 mg/dL 86  BUN 8 - 27 mg/dL 20  Creatinine 0.57 - 1.00 mg/dL 1.17(H)  Sodium 134 - 144 mmol/L 144  Potassium 3.5 - 5.2 mmol/L 5.4(H)  Chloride 96 - 106 mmol/L 102  CO2 20 - 29 mmol/L 25  Calcium 8.7 - 10.3 mg/dL 9.4  Total Protein 6.0 - 8.5 g/dL 6.5  Total Bilirubin 0.0 - 1.2  mg/dL 0.6  Alkaline Phos 39 - 117 IU/L 63  AST 0 - 40 IU/L 23  ALT 0 - 32 IU/L 11   Labs from 03/31/2019-[facility]  -CBC with hemoglobin 13.9, MCV 100, platelets 276, WBC count 6.3. -UA unremarkable -CMP normal except for CO2 34.  Normal LFTs with AST 34, ALT 17. -Lipase normal at 84 -Normal PT INR  Discussed in detail with  the patient and patient's daughter.   Edman Circle, MD 06/20/2019, 8:39 AM  Cc: Paulina Fusi, MD

## 2019-06-20 NOTE — Addendum Note (Signed)
Addended by: Alberteen Sam E on: 06/20/2019 12:12 PM   Modules accepted: Orders

## 2019-06-20 NOTE — Patient Instructions (Signed)
Continue Omeprazole 40 mg every morning   Take Pepcid 20 mg daily as needed   Continue stool softener 1 daily   Stop Carafate  Thank you,  Dr. Lynann Bologna

## 2019-06-23 ENCOUNTER — Other Ambulatory Visit: Payer: Self-pay | Admitting: Cardiology

## 2019-07-03 ENCOUNTER — Ambulatory Visit: Payer: Medicare HMO | Admitting: Cardiology

## 2019-07-03 ENCOUNTER — Other Ambulatory Visit: Payer: Self-pay

## 2019-07-03 ENCOUNTER — Encounter: Payer: Self-pay | Admitting: Cardiology

## 2019-07-03 VITALS — BP 124/70 | HR 86 | Temp 98.1°F | Ht 62.0 in | Wt 184.2 lb

## 2019-07-03 DIAGNOSIS — I5032 Chronic diastolic (congestive) heart failure: Secondary | ICD-10-CM | POA: Diagnosis not present

## 2019-07-03 DIAGNOSIS — I4819 Other persistent atrial fibrillation: Secondary | ICD-10-CM

## 2019-07-03 DIAGNOSIS — I110 Hypertensive heart disease with heart failure: Secondary | ICD-10-CM

## 2019-07-03 DIAGNOSIS — Z7901 Long term (current) use of anticoagulants: Secondary | ICD-10-CM

## 2019-07-03 DIAGNOSIS — E782 Mixed hyperlipidemia: Secondary | ICD-10-CM | POA: Diagnosis not present

## 2019-07-03 NOTE — Patient Instructions (Signed)

## 2019-07-03 NOTE — Progress Notes (Signed)
Cardiology Office Note:    Date:  07/03/2019   ID:  Robin Salazar, DOB October 02, 1940, MRN [id number]  PCP:  Robin Like, NP  Cardiologist:  Robin Herrlich, MD    Referring MD: Robin Fusi, MD    ASSESSMENT:    1. Chronic diastolic heart failure (HCC)   2. Persistent atrial fibrillation (HCC)   3. Chronic anticoagulation    PLAN:    In order of problems listed above:  1. Heart failure is nicely compensated fortunately her daughter supervises her I would continue her current loop diuretic and strongly encouraged him to find a reminder for EKG today and suggested a large amount to be left in the mirror in her bathroom. 2. Stable rate is controlled continue her beta-blocker goal heart rate 60 to 100 bpm along with his current anticoagulant tolerated without bleeding 3. Stable hyperlipidemia lipids are at target 4. Hypertension at target continue treatment including ARB she has normal renal function potassium   Next appointment: 6 months   Medication Adjustments/Labs and Tests Ordered: Current medicines are reviewed at length with the patient today.  Concerns regarding medicines are outlined above.  No orders of the defined types were placed in this encounter.  No orders of the defined types were placed in this encounter.   Chief Complaint  Patient presents with   Follow-up   Congestive Heart Failure   Hypertension   Hyperlipidemia    History of Present Illness:    Robin Salazar is a 79 y.o. female with a hx of Diastolic CHF, persitent Atrial Fibrillation/atypical atrial flutter, HTN, Hyperlipidemia  last seen 01/04/2019. Compliance with diet, lifestyle and medications: Not always, they are having trouble getting her way herself at home but her medications are supervised.  She does not check heart rate or blood pressure at home.  She has been seen by Dr. Chales Abrahams for gastritis she is improved and has had no bleeding with her anticoagulant.  She has had no edema  shortness of breath orthopnea chest pain palpitation or syncope.  Recent labs performed by her primary care physician seventy-three zero three two thousand twenty-one showed lipids ideal cholesterol one fifty-five HDL sixty-six LDL seventy-one A1c at target 5% creatinine 1.13 stable. Past Medical History:  Diagnosis Date   Allergy    Arthritis    Asthma    anoro - inhaler   Cataract    left eye   CHF (congestive heart failure) (HCC)    Chronic anticoagulation 09/09/2015   Clotting disorder (HCC)    anticoagulant - a fib   COPD (chronic obstructive pulmonary disease) (HCC)    History of Helicobacter pylori infection    Hyperlipidemia 11/03/2016   Hypertension    Hypertensive heart disease with heart failure (HCC) 09/09/2015   Persistent atrial fibrillation (HCC) 09/09/2015    Past Surgical History:  Procedure Laterality Date   ABDOMINAL HYSTERECTOMY     APPENDECTOMY     CHOLECYSTECTOMY     COLONOSCOPY     TONSILLECTOMY      Current Medications: Current Meds  Medication Sig   albuterol (PROAIR HFA) 108 (90 Base) MCG/ACT inhaler Inhale 1 puff into the lungs every 6 (six) hours as needed for wheezing or shortness of breath.   ANORO ELLIPTA 62.5-25 MCG/INH AEPB TAKE 1 PUFF BY MOUTH EVERY DAY   apixaban (ELIQUIS) 5 MG TABS tablet Take 5 mg by mouth 2 (two) times daily.   CALCIUM PO Take 1 tablet by mouth daily.   Calcium-Vitamin D-Vitamin K (CHEWABLE  CALCIUM PO) Take 1 tablet by mouth 2 (two) times daily.   cetirizine (ZYRTEC ALLERGY) 10 MG tablet Take 10 mg by mouth daily.   Docusate Calcium (STOOL SOFTENER PO) Take 1 tablet by mouth daily.   donepezil (ARICEPT) 10 MG tablet Take 10 mg by mouth daily.   famotidine (PEPCID) 20 MG tablet Take 1 tablet (20 mg total) by mouth at bedtime.   furosemide (LASIX) 20 MG tablet TAKE 2 TABLETS BY MOUTH EVERY DAY   KLOR-CON M20 20 MEQ tablet Take 20 mEq by mouth 2 (two) times daily.   memantine (NAMENDA) 5 MG  tablet Take 5 mg by mouth 2 (two) times daily.   metoprolol tartrate (LOPRESSOR) 25 MG tablet 2 tablets in the Am and 1 tablet in the pm   Multiple Vitamins-Minerals (MULTIVITAMIN ADULT) CHEW Chew 1 tablet by mouth 2 (two) times daily.   omeprazole (PRILOSEC) 40 MG capsule Take 1 capsule (40 mg total) by mouth daily.   simvastatin (ZOCOR) 40 MG tablet Take 40 mg by mouth daily.   sucralfate (CARAFATE) 1 g tablet 1 tablet as directed. Take before each meal   telmisartan (MICARDIS) 80 MG tablet Take 80 mg by mouth daily.   Vitamin D, Ergocalciferol, (DRISDOL) 50000 units CAPS capsule Take 1 capsule by mouth once a week. Only on Sunday   Current Facility-Administered Medications for the 07/03/19 encounter (Office Visit) with Richardo Priest, MD  Medication   0.9 %  sodium chloride infusion     Allergies:   Contrast media [iodinated diagnostic agents]   Social History   Socioeconomic History   Marital status: Married    Spouse name: Not on file   Number of children: Not on file   Years of education: Not on file   Highest education level: Not on file  Occupational History   Not on file  Tobacco Use   Smoking status: Never Smoker   Smokeless tobacco: Never Used  Substance and Sexual Activity   Alcohol use: No   Drug use: No   Sexual activity: Not Currently  Other Topics Concern   Not on file  Social History Narrative   Not on file   Social Determinants of Health   Financial Resource Strain:    Difficulty of Paying Living Expenses:   Food Insecurity:    Worried About Charity fundraiser in the Last Year:    Arboriculturist in the Last Year:   Transportation Needs:    Film/video editor (Medical):    Lack of Transportation (Non-Medical):   Physical Activity:    Days of Exercise per Week:    Minutes of Exercise per Session:   Stress:    Feeling of Stress :   Social Connections:    Frequency of Communication with Friends and Family:     Frequency of Social Gatherings with Friends and Family:    Attends Religious Services:    Active Member of Clubs or Organizations:    Attends Archivist Meetings:    Marital Status:      Family History: The patient's family history includes Diabetes in her father; Heart disease in her father and mother. There is no history of Colon cancer, Esophageal cancer, Rectal cancer, or Stomach cancer. ROS:   Please see the history of present illness.    All other systems reviewed and are negative.  EKGs/Labs/Other Studies Reviewed:    The following studies were reviewed today:  EKG:  EKG performed showed atrial fibrillation  controlled rate  Recent Labs: 01/04/2019: ALT 11; BUN 20; Creatinine, Ser 1.17; NT-Pro BNP 4,123; Potassium 5.4; Sodium 144  Recent Lipid Panel    Component Value Date/Time   CHOL 169 01/04/2019 1144   TRIG 102 01/04/2019 1144   HDL 71 01/04/2019 1144   CHOLHDL 2.4 01/04/2019 1144   LDLCALC 80 01/04/2019 1144    Physical Exam:    VS:  BP 124/70    Pulse 86    Temp 98.1 F (36.7 C)    Ht 5\' 2"  (1.575 m)    Wt 184 lb 3.2 oz (83.6 kg)    SpO2 98%    BMI 33.69 kg/m     Wt Readings from Last 3 Encounters:  07/03/19 184 lb 3.2 oz (83.6 kg)  06/20/19 187 lb 6 oz (85 kg)  04/12/19 186 lb (84.4 kg)     GEN: Elderly well nourished, well developed in no acute distress HEENT: Normal NECK: No JVD; No carotid bruits LYMPHATICS: No lymphadenopathy CARDIAC: Irregular S1 variable , no murmurs, rubs, gallops RESPIRATORY:  Clear to auscultation without rales, wheezing or rhonchi  ABDOMEN: Soft, non-tender, non-distended MUSCULOSKELETAL:  No edema; No deformity  SKIN: Warm and dry NEUROLOGIC:  Alert and oriented x 3 PSYCHIATRIC:  Normal affect    Signed, 04/14/19, MD  07/03/2019 2:11 PM    [facility]

## 2019-07-12 DIAGNOSIS — Q120 Congenital cataract: Secondary | ICD-10-CM | POA: Diagnosis not present

## 2019-07-12 DIAGNOSIS — Z961 Presence of intraocular lens: Secondary | ICD-10-CM | POA: Diagnosis not present

## 2019-10-12 ENCOUNTER — Other Ambulatory Visit: Payer: Self-pay | Admitting: Cardiology

## 2019-10-14 DIAGNOSIS — R5383 Other fatigue: Secondary | ICD-10-CM | POA: Diagnosis not present

## 2019-10-14 DIAGNOSIS — N390 Urinary tract infection, site not specified: Secondary | ICD-10-CM | POA: Diagnosis not present

## 2019-10-15 DIAGNOSIS — K449 Diaphragmatic hernia without obstruction or gangrene: Secondary | ICD-10-CM | POA: Diagnosis not present

## 2019-10-15 DIAGNOSIS — N281 Cyst of kidney, acquired: Secondary | ICD-10-CM | POA: Diagnosis not present

## 2019-10-15 DIAGNOSIS — K5730 Diverticulosis of large intestine without perforation or abscess without bleeding: Secondary | ICD-10-CM | POA: Diagnosis not present

## 2019-10-15 DIAGNOSIS — I700 Atherosclerosis of aorta: Secondary | ICD-10-CM | POA: Diagnosis not present

## 2019-10-15 DIAGNOSIS — K5792 Diverticulitis of intestine, part unspecified, without perforation or abscess without bleeding: Secondary | ICD-10-CM | POA: Diagnosis not present

## 2019-10-16 DIAGNOSIS — R5383 Other fatigue: Secondary | ICD-10-CM | POA: Diagnosis not present

## 2019-10-16 DIAGNOSIS — R519 Headache, unspecified: Secondary | ICD-10-CM | POA: Diagnosis not present

## 2019-10-16 DIAGNOSIS — Z20828 Contact with and (suspected) exposure to other viral communicable diseases: Secondary | ICD-10-CM | POA: Diagnosis not present

## 2019-10-19 DIAGNOSIS — R279 Unspecified lack of coordination: Secondary | ICD-10-CM | POA: Diagnosis not present

## 2019-10-19 DIAGNOSIS — I4891 Unspecified atrial fibrillation: Secondary | ICD-10-CM | POA: Diagnosis not present

## 2019-10-19 DIAGNOSIS — R4020 Unspecified coma: Secondary | ICD-10-CM | POA: Diagnosis not present

## 2019-10-19 DIAGNOSIS — R410 Disorientation, unspecified: Secondary | ICD-10-CM | POA: Diagnosis not present

## 2019-10-19 DIAGNOSIS — J1282 Pneumonia due to coronavirus disease 2019: Secondary | ICD-10-CM | POA: Diagnosis not present

## 2019-10-19 DIAGNOSIS — I2510 Atherosclerotic heart disease of native coronary artery without angina pectoris: Secondary | ICD-10-CM | POA: Diagnosis not present

## 2019-10-19 DIAGNOSIS — U071 COVID-19: Secondary | ICD-10-CM | POA: Diagnosis not present

## 2019-10-19 DIAGNOSIS — E871 Hypo-osmolality and hyponatremia: Secondary | ICD-10-CM | POA: Diagnosis not present

## 2019-10-19 DIAGNOSIS — R4182 Altered mental status, unspecified: Secondary | ICD-10-CM | POA: Diagnosis not present

## 2019-10-19 DIAGNOSIS — E785 Hyperlipidemia, unspecified: Secondary | ICD-10-CM | POA: Diagnosis not present

## 2019-10-19 DIAGNOSIS — I480 Paroxysmal atrial fibrillation: Secondary | ICD-10-CM | POA: Diagnosis not present

## 2019-10-19 DIAGNOSIS — G9341 Metabolic encephalopathy: Secondary | ICD-10-CM | POA: Diagnosis not present

## 2019-10-19 DIAGNOSIS — J189 Pneumonia, unspecified organism: Secondary | ICD-10-CM | POA: Diagnosis not present

## 2019-10-19 DIAGNOSIS — Z743 Need for continuous supervision: Secondary | ICD-10-CM | POA: Diagnosis not present

## 2019-10-19 DIAGNOSIS — J440 Chronic obstructive pulmonary disease with acute lower respiratory infection: Secondary | ICD-10-CM | POA: Diagnosis not present

## 2019-10-19 DIAGNOSIS — I7090 Unspecified atherosclerosis: Secondary | ICD-10-CM | POA: Diagnosis not present

## 2019-10-19 DIAGNOSIS — R404 Transient alteration of awareness: Secondary | ICD-10-CM | POA: Diagnosis not present

## 2019-10-19 DIAGNOSIS — Z043 Encounter for examination and observation following other accident: Secondary | ICD-10-CM | POA: Diagnosis not present

## 2019-10-19 DIAGNOSIS — R69 Illness, unspecified: Secondary | ICD-10-CM | POA: Diagnosis not present

## 2019-10-20 DIAGNOSIS — G9341 Metabolic encephalopathy: Secondary | ICD-10-CM | POA: Diagnosis not present

## 2019-10-20 DIAGNOSIS — U071 COVID-19: Secondary | ICD-10-CM | POA: Diagnosis not present

## 2019-10-21 DIAGNOSIS — G9341 Metabolic encephalopathy: Secondary | ICD-10-CM | POA: Diagnosis not present

## 2019-10-21 DIAGNOSIS — U071 COVID-19: Secondary | ICD-10-CM | POA: Diagnosis not present

## 2019-10-22 DIAGNOSIS — U071 COVID-19: Secondary | ICD-10-CM | POA: Diagnosis not present

## 2019-10-22 DIAGNOSIS — Z743 Need for continuous supervision: Secondary | ICD-10-CM | POA: Diagnosis not present

## 2019-10-22 DIAGNOSIS — G9341 Metabolic encephalopathy: Secondary | ICD-10-CM | POA: Diagnosis not present

## 2019-10-22 DIAGNOSIS — R410 Disorientation, unspecified: Secondary | ICD-10-CM | POA: Diagnosis not present

## 2019-10-22 DIAGNOSIS — R279 Unspecified lack of coordination: Secondary | ICD-10-CM | POA: Diagnosis not present

## 2019-10-24 ENCOUNTER — Inpatient Hospital Stay (HOSPITAL_COMMUNITY)
Admission: EM | Admit: 2019-10-24 | Discharge: 2019-10-29 | DRG: 177 | Disposition: A | Discharge status: Hospice | Payer: Medicare HMO | Attending: Internal Medicine | Admitting: Internal Medicine

## 2019-10-24 ENCOUNTER — Emergency Department (HOSPITAL_COMMUNITY): Payer: Medicare HMO

## 2019-10-24 DIAGNOSIS — G309 Alzheimer's disease, unspecified: Secondary | ICD-10-CM | POA: Diagnosis present

## 2019-10-24 DIAGNOSIS — Z8249 Family history of ischemic heart disease and other diseases of the circulatory system: Secondary | ICD-10-CM | POA: Diagnosis not present

## 2019-10-24 DIAGNOSIS — Z66 Do not resuscitate: Secondary | ICD-10-CM | POA: Diagnosis not present

## 2019-10-24 DIAGNOSIS — R531 Weakness: Secondary | ICD-10-CM | POA: Diagnosis not present

## 2019-10-24 DIAGNOSIS — I4819 Other persistent atrial fibrillation: Secondary | ICD-10-CM | POA: Diagnosis present

## 2019-10-24 DIAGNOSIS — R4182 Altered mental status, unspecified: Secondary | ICD-10-CM | POA: Diagnosis not present

## 2019-10-24 DIAGNOSIS — Z7189 Other specified counseling: Secondary | ICD-10-CM

## 2019-10-24 DIAGNOSIS — N390 Urinary tract infection, site not specified: Secondary | ICD-10-CM | POA: Diagnosis present

## 2019-10-24 DIAGNOSIS — R451 Restlessness and agitation: Secondary | ICD-10-CM | POA: Diagnosis not present

## 2019-10-24 DIAGNOSIS — R0902 Hypoxemia: Secondary | ICD-10-CM | POA: Diagnosis not present

## 2019-10-24 DIAGNOSIS — Z6831 Body mass index (BMI) 31.0-31.9, adult: Secondary | ICD-10-CM

## 2019-10-24 DIAGNOSIS — I110 Hypertensive heart disease with heart failure: Secondary | ICD-10-CM | POA: Diagnosis present

## 2019-10-24 DIAGNOSIS — N179 Acute kidney failure, unspecified: Secondary | ICD-10-CM | POA: Diagnosis not present

## 2019-10-24 DIAGNOSIS — Z91041 Radiographic dye allergy status: Secondary | ICD-10-CM

## 2019-10-24 DIAGNOSIS — T17920A Food in respiratory tract, part unspecified causing asphyxiation, initial encounter: Secondary | ICD-10-CM | POA: Diagnosis not present

## 2019-10-24 DIAGNOSIS — J189 Pneumonia, unspecified organism: Secondary | ICD-10-CM | POA: Diagnosis not present

## 2019-10-24 DIAGNOSIS — U071 COVID-19: Secondary | ICD-10-CM | POA: Diagnosis not present

## 2019-10-24 DIAGNOSIS — E785 Hyperlipidemia, unspecified: Secondary | ICD-10-CM | POA: Diagnosis present

## 2019-10-24 DIAGNOSIS — Z7401 Bed confinement status: Secondary | ICD-10-CM | POA: Diagnosis not present

## 2019-10-24 DIAGNOSIS — Z7901 Long term (current) use of anticoagulants: Secondary | ICD-10-CM | POA: Diagnosis not present

## 2019-10-24 DIAGNOSIS — Z79899 Other long term (current) drug therapy: Secondary | ICD-10-CM

## 2019-10-24 DIAGNOSIS — R630 Anorexia: Secondary | ICD-10-CM | POA: Diagnosis present

## 2019-10-24 DIAGNOSIS — Z515 Encounter for palliative care: Secondary | ICD-10-CM | POA: Diagnosis not present

## 2019-10-24 DIAGNOSIS — J1282 Pneumonia due to coronavirus disease 2019: Secondary | ICD-10-CM | POA: Diagnosis not present

## 2019-10-24 DIAGNOSIS — F05 Delirium due to known physiological condition: Secondary | ICD-10-CM | POA: Diagnosis present

## 2019-10-24 DIAGNOSIS — J440 Chronic obstructive pulmonary disease with acute lower respiratory infection: Secondary | ICD-10-CM | POA: Diagnosis present

## 2019-10-24 DIAGNOSIS — I517 Cardiomegaly: Secondary | ICD-10-CM | POA: Diagnosis not present

## 2019-10-24 DIAGNOSIS — R69 Illness, unspecified: Secondary | ICD-10-CM | POA: Diagnosis not present

## 2019-10-24 DIAGNOSIS — Z23 Encounter for immunization: Secondary | ICD-10-CM

## 2019-10-24 DIAGNOSIS — I5032 Chronic diastolic (congestive) heart failure: Secondary | ICD-10-CM | POA: Diagnosis present

## 2019-10-24 DIAGNOSIS — R627 Adult failure to thrive: Secondary | ICD-10-CM | POA: Diagnosis present

## 2019-10-24 DIAGNOSIS — E860 Dehydration: Secondary | ICD-10-CM | POA: Diagnosis present

## 2019-10-24 DIAGNOSIS — F0280 Dementia in other diseases classified elsewhere without behavioral disturbance: Secondary | ICD-10-CM | POA: Diagnosis present

## 2019-10-24 DIAGNOSIS — I10 Essential (primary) hypertension: Secondary | ICD-10-CM | POA: Diagnosis not present

## 2019-10-24 DIAGNOSIS — R404 Transient alteration of awareness: Secondary | ICD-10-CM | POA: Diagnosis not present

## 2019-10-24 DIAGNOSIS — R1310 Dysphagia, unspecified: Secondary | ICD-10-CM | POA: Diagnosis present

## 2019-10-24 DIAGNOSIS — Z833 Family history of diabetes mellitus: Secondary | ICD-10-CM

## 2019-10-24 DIAGNOSIS — R5381 Other malaise: Secondary | ICD-10-CM | POA: Diagnosis not present

## 2019-10-24 DIAGNOSIS — Z9114 Patient's other noncompliance with medication regimen: Secondary | ICD-10-CM

## 2019-10-24 DIAGNOSIS — I4891 Unspecified atrial fibrillation: Secondary | ICD-10-CM | POA: Diagnosis not present

## 2019-10-24 DIAGNOSIS — M2550 Pain in unspecified joint: Secondary | ICD-10-CM | POA: Diagnosis not present

## 2019-10-24 LAB — URINALYSIS, ROUTINE W REFLEX MICROSCOPIC
Bacteria, UA: NONE SEEN
Bilirubin Urine: NEGATIVE
Glucose, UA: NEGATIVE mg/dL
Hgb urine dipstick: NEGATIVE
Ketones, ur: 20 mg/dL — AB
Nitrite: NEGATIVE
Protein, ur: NEGATIVE mg/dL
Specific Gravity, Urine: 1.013 (ref 1.005–1.030)
pH: 6 (ref 5.0–8.0)

## 2019-10-24 LAB — C-REACTIVE PROTEIN: CRP: 5.9 mg/dL — ABNORMAL HIGH (ref <1.0)

## 2019-10-24 LAB — COMPREHENSIVE METABOLIC PANEL
ALT: 22 U/L (ref 0–44)
AST: 32 U/L (ref 15–41)
Albumin: 2.9 g/dL — ABNORMAL LOW (ref 3.5–5.0)
Alkaline Phosphatase: 46 U/L (ref 38–126)
Anion gap: 13 (ref 5–15)
BUN: 14 mg/dL (ref 8–23)
CO2: 25 mmol/L (ref 22–32)
Calcium: 8.5 mg/dL — ABNORMAL LOW (ref 8.9–10.3)
Chloride: 101 mmol/L (ref 98–111)
Creatinine, Ser: 1.02 mg/dL — ABNORMAL HIGH (ref 0.44–1.00)
GFR calc Af Amer: >60 mL/min (ref >60)
GFR calc non Af Amer: 53 mL/min — ABNORMAL LOW (ref >60)
Glucose, Bld: 93 mg/dL (ref 70–99)
Potassium: 4.1 mmol/L (ref 3.5–5.1)
Sodium: 139 mmol/L (ref 135–145)
Total Bilirubin: 1.1 mg/dL (ref 0.3–1.2)
Total Protein: 6.6 g/dL (ref 6.5–8.1)

## 2019-10-24 LAB — CBC WITH DIFFERENTIAL/PLATELET
Abs Immature Granulocytes: 0.07 10*3/uL (ref 0.00–0.07)
Basophils Absolute: 0 10*3/uL (ref 0.0–0.1)
Basophils Relative: 0 %
Eosinophils Absolute: 0.1 10*3/uL (ref 0.0–0.5)
Eosinophils Relative: 1 %
HCT: 42.2 % (ref 36.0–46.0)
Hemoglobin: 13.6 g/dL (ref 12.0–15.0)
Immature Granulocytes: 1 %
Lymphocytes Relative: 12 %
Lymphs Abs: 0.7 10*3/uL (ref 0.7–4.0)
MCH: 31.1 pg (ref 26.0–34.0)
MCHC: 32.2 g/dL (ref 30.0–36.0)
MCV: 96.6 fL (ref 80.0–100.0)
Monocytes Absolute: 0.9 10*3/uL (ref 0.1–1.0)
Monocytes Relative: 14 %
Neutro Abs: 4.3 10*3/uL (ref 1.7–7.7)
Neutrophils Relative %: 72 %
Platelets: 272 10*3/uL (ref 150–400)
RBC: 4.37 MIL/uL (ref 3.87–5.11)
RDW: 12.7 % (ref 11.5–15.5)
WBC: 6.1 10*3/uL (ref 4.0–10.5)
nRBC: 0 % (ref 0.0–0.2)

## 2019-10-24 LAB — PROCALCITONIN: Procalcitonin: <0.1 ng/mL

## 2019-10-24 LAB — TRIGLYCERIDES: Triglycerides: 123 mg/dL (ref <150)

## 2019-10-24 LAB — LACTATE DEHYDROGENASE: LDH: 250 U/L — ABNORMAL HIGH (ref 98–192)

## 2019-10-24 LAB — FIBRINOGEN: Fibrinogen: 758 mg/dL — ABNORMAL HIGH (ref 210–475)

## 2019-10-24 LAB — SARS CORONAVIRUS 2 BY RT PCR (HOSPITAL ORDER, PERFORMED IN ~~LOC~~ HOSPITAL LAB): SARS Coronavirus 2: POSITIVE — AB

## 2019-10-24 LAB — D-DIMER, QUANTITATIVE: D-Dimer, Quant: 0.68 ug/mL-FEU — ABNORMAL HIGH (ref 0.00–0.50)

## 2019-10-24 LAB — FERRITIN: Ferritin: 440 ng/mL — ABNORMAL HIGH (ref 11–307)

## 2019-10-24 LAB — LACTIC ACID, PLASMA: Lactic Acid, Venous: 1.3 mmol/L (ref 0.5–1.9)

## 2019-10-24 MED ORDER — SODIUM CHLORIDE 0.9 % IV BOLUS
500.0000 mL | Freq: Once | INTRAVENOUS | Status: AC
  Administered 2019-10-24: 500 mL via INTRAVENOUS

## 2019-10-24 MED ORDER — UMECLIDINIUM-VILANTEROL 62.5-25 MCG/INH IN AEPB
1.0000 | INHALATION_SPRAY | Freq: Every day | RESPIRATORY_TRACT | Status: DC
  Filled 2019-10-24 (×2): qty 14

## 2019-10-24 MED ORDER — DEXTROSE IN LACTATED RINGERS 5 % IV SOLN: INTRAVENOUS | Status: DC

## 2019-10-24 MED ORDER — SUCRALFATE 1 G PO TABS: 1.0000 g | ORAL_TABLET | Freq: Three times a day (TID) | ORAL | Status: DC

## 2019-10-24 MED ORDER — CEFDINIR 300 MG PO CAPS
300.0000 mg | ORAL_CAPSULE | Freq: Two times a day (BID) | ORAL | Status: DC
  Administered 2019-10-24: 300 mg via ORAL
  Filled 2019-10-24 (×3): qty 1

## 2019-10-24 MED ORDER — IRBESARTAN 150 MG PO TABS
150.0000 mg | ORAL_TABLET | Freq: Every day | ORAL | Status: DC
  Filled 2019-10-24: qty 1

## 2019-10-24 MED ORDER — ALBUTEROL SULFATE HFA 108 (90 BASE) MCG/ACT IN AERS
1.0000 | INHALATION_SPRAY | Freq: Four times a day (QID) | RESPIRATORY_TRACT | Status: DC | PRN
  Filled 2019-10-24: qty 6.7

## 2019-10-24 MED ORDER — PANTOPRAZOLE SODIUM 40 MG PO TBEC
40.0000 mg | DELAYED_RELEASE_TABLET | Freq: Every day | ORAL | Status: DC
  Filled 2019-10-24: qty 1

## 2019-10-24 MED ORDER — DEXTROSE IN LACTATED RINGERS 5 % IV SOLN: INTRAVENOUS | Status: AC

## 2019-10-24 MED ORDER — ACETAMINOPHEN 325 MG PO TABS: 650.0000 mg | ORAL_TABLET | Freq: Four times a day (QID) | ORAL | Status: DC | PRN

## 2019-10-24 MED ORDER — METOPROLOL TARTRATE 25 MG PO TABS
25.0000 mg | ORAL_TABLET | Freq: Two times a day (BID) | ORAL | Status: DC
  Administered 2019-10-24: 25 mg via ORAL
  Filled 2019-10-24: qty 1

## 2019-10-24 MED ORDER — POLYETHYLENE GLYCOL 3350 17 G PO PACK: 17.0000 g | PACK | Freq: Every day | ORAL | Status: DC | PRN

## 2019-10-24 MED ORDER — APIXABAN 5 MG PO TABS
5.0000 mg | ORAL_TABLET | Freq: Two times a day (BID) | ORAL | Status: DC
  Administered 2019-10-24: 5 mg via ORAL
  Filled 2019-10-24: qty 1

## 2019-10-24 MED ORDER — SIMVASTATIN 20 MG PO TABS
40.0000 mg | ORAL_TABLET | Freq: Every day | ORAL | Status: DC
  Administered 2019-10-24: 40 mg via ORAL
  Filled 2019-10-24: qty 2

## 2019-10-24 MED ORDER — ACETAMINOPHEN 650 MG RE SUPP: 650.0000 mg | Freq: Four times a day (QID) | RECTAL | Status: DC | PRN

## 2019-10-24 NOTE — ED Notes (Signed)
Pt removed all monitoring. Refusing blood draws.

## 2019-10-24 NOTE — ED Triage Notes (Signed)
Pt arrived to ED d/t family concerns of failure to thrive. She was also tested positive for Covid on the 10/18/19. Pt has a baseline of A/Ox1 & dementia.

## 2019-10-24 NOTE — ED Notes (Signed)
Pt pulled out IV. Follow up attempt for IV access unsuccessful. IV team consult placed.

## 2019-10-24 NOTE — H&P (Addendum)
Date: 10/24/2019               Patient Name:  Robin Salazar MRN: [id number]  DOB: November 10, 1940 Age / Sex: 79 y.o., female   PCP: Jim Like, NP         Medical Service: Internal Medicine Teaching Service         Attending Physician: Dr. Mayford Knife, Dorene Ar, MD    First Contact: Dr. Cleaster Corin Pager: [phone]            After Hours (After 5p/  First Contact Pager: [phone]  weekends / holidays): Second Contact Pager: [phone]   Chief Complaint: dehydration  History of Present Illness:   Robin Salazar is a 79 yo female with PMH of CHF, dementia, persistent atrial fibrillation, HTN, and COPD presenting from home due to not eating or taking medications. She has severe dementia and was unable to provide history and was only oriented to self. History was provided by her daughter.  Per her daughter she started to have symptoms of worse confusion than her baseline, refusing to take medications. She went to her doctor and was diagnosed with a UTI and started on antibiotics. She went back to the doctor's on 9/1 and was diagnosed with covid, and was then admitted to [facility] on 9/4 due to increasing weakness and new supplemental oxygen requirement. She was discharged on 9/7 but since she has gotten home she has had trouble making it to the restroom on her own, been very week, and is not eating or drinking anything. She did receive what sounds like a monoclonal antibody infusion on 9/3.  The patient's primary caregiver is her husband, whom she lives with alone. The patient is usually able to walk on her own but cannot take care of regular IADLs due to her dementia. Her daughter thinks she was diagnosed with dementia around 2.5 years ago but is not certain.  She states her mom has not indicated that she has been in any pain, no cough, she is making urine, has been afebrile. She is not sure when she last had a BM.   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   ANORO ELLIPTA 62.5-25 MCG/INH AEPB Inhale 1 puff into the lungs daily.    apixaban (ELIQUIS) 5 MG TABS tablet Take 5 mg by mouth 2 (two) times daily.   CALCIUM PO Take 1 tablet by mouth daily.   cefdinir (OMNICEF) 300 MG capsule Take 300 mg by mouth 2 (two) times daily. FOR 5 DAYS   cetirizine (ZYRTEC ALLERGY) 10 MG tablet Take 10 mg by mouth daily.   docusate  sodium (COLACE) 100 MG capsule Take 100 mg by mouth in the morning.   donepezil (ARICEPT) 10 MG tablet Take 10 mg by mouth at bedtime.    furosemide (LASIX) 20 MG tablet TAKE 2 TABLETS BY MOUTH EVERY DAY (Patient taking differently: Take 40 mg by mouth in the morning. )   KLOR-CON M20 20 MEQ tablet Take 20 mEq by mouth 2 (two) times daily.   memantine (NAMENDA) 5 MG tablet Take 5 mg by mouth 2 (two) times daily.   metoprolol tartrate (LOPRESSOR) 25 MG tablet Take 25-50 mg by mouth See admin instructions. Take 50 mg by mouth in the morning and 25 mg in the evening   Multiple Vitamins-Minerals (MULTIVITAMIN ADULT) CHEW Chew 1 tablet by mouth 2 (two) times daily.   omeprazole (PRILOSEC) 40 MG capsule Take 1 capsule (40 mg total) by mouth daily. (Patient taking differently: Take 40 mg by mouth daily before breakfast. )   QUEtiapine (SEROQUEL) 25  MG tablet Take 25 mg by mouth at bedtime.   simvastatin (ZOCOR) 40 MG tablet Take 40 mg by mouth at bedtime.    sucralfate (CARAFATE) 1 g tablet Take 1 tablet by mouth See admin instructions. Take 1 gram by mouth three times a day before meals as needed/AS DIRECTED   telmisartan (MICARDIS) 80 MG tablet Take 80 mg by mouth at bedtime.    Vitamin D, Ergocalciferol, (DRISDOL) 50000 units CAPS capsule Take 50,000 Units by mouth every 14 (fourteen) days.    Vitamin D q2weeks eliquis 5mg  bid  Klor Con 2 tablets bid Metoprolol 25 mg - two tablets in the am, one in the evening  Lasix 20 mg bid  Calcium Multivitamin Stool softener Omeprazole 40 mg  memantine 5 mg bid  telmisartan 80 mg Donepezil 10 mg qd  Cetirizine 10 mg qd  simvastatin 40 mg qd  Sucralfate 1 g qc Cefdinir for UTI or pneumonia 300 mg bid  seroquel 12.5 qhs - can give a 25 mg if needed   Allergies: Allergies as of 10/24/2019 - Review Complete 10/24/2019  Allergen Reaction Noted   Contrast media [iodinated diagnostic agents] Other (See Comments) 09/28/2017   Past Medical History:  Diagnosis Date   Allergy    Arthritis    Asthma    anoro - inhaler   Cataract    left eye   CHF (congestive heart failure) (HCC)    Chronic anticoagulation 09/09/2015   Clotting disorder (HCC)    anticoagulant - a fib   COPD (chronic obstructive pulmonary disease) (HCC)    History of Helicobacter pylori infection    Hyperlipidemia 11/03/2016   Hypertension    Hypertensive heart disease with heart failure (HCC) 09/09/2015   Persistent atrial fibrillation (HCC) 09/09/2015     Review of Systems: A complete ROS was negative except as per HPI.   Physical Exam: Blood pressure (!) 155/80, pulse 73, temperature 98 F (36.7 C), temperature source Oral, resp. rate 14, SpO2 100 %.  Constitution: NAD, supine in bed HENT: [place]/AT Eyes: no icterus or injection  Cardio: irregular rate and rhythm Respiratory: unable to do proper  auscultation but clear breath sounds from anterior chest, 96% on room air, non-labored breathing Abdominal: +BS, no wincing to palpation, soft, non-distended MSK: moving all extremities, strength bilateral upper extremities symmetrical, unable to test other strength due to patient confusion  Neuro: CN grossly intact, unable to complete full CN exam  Skin: scattered hematomas over upper and lower extremities, otherwise c/d/i    EKG:  personally reviewed my interpretation is atrial fibrillation   CXR: personally reviewed my interpretation is cardiomegaly, mild scattered opacities with slightly more basilar predominance   Assessment & Plan by Problem: Active Problems:   COVID-19  Failure to Thrive Secondary to Covid-19 Recent Covid 19 Pneumonia Dementia AKI  79 yo female with worsening mental status and failure to thrive in setting recent covid infection. Her respiratory status is stable, but since returning home she is not eating, drinking or taking her medications and has developed mild acute kidney injury. Per daughter, she was also recently diagnosed with UTI. Discussing with her daughter, they are ultimately hoping to get her back to baseline where she is able to walk around and have normal po intake. We discussed that while this may be possible, she will likely need rehab before going home, and it is possible that she also may not recover her prior functional status.   - PT/OT  - consult to [facility] for SNF placement  - s/p 500 cc in the ER, continue gentle IVF  - continue completion of cefdinir for three more days s/p hospital discharge for pneumonia vs. UTI  - NPO pending SLP eval  - will reach out to husband for further history and possible long term goals of the patient as she is unable to discuss and her daughter is not sure about this.   Chronic diastolic CHF Persistent Atrial Fibrillation HTN  Unclear if history of reduced or preserved ejection fraction. She is no metoprolol but  this may also be due to her A. Fib.   - continue metoprolol - cont. Half dose of home ARB for tonight - continue eliquis   Dementia Per daughter she is slightly more confused than her baseline, where she takes her medications and is able to use the restroom on her own.   - Hold aricept and memantine.  - Will also hold seroquel for now, daughter not completely sure why this was started during her recent hospital admission. Can restart if unable to reorient patient.    Diet: NPO VTE: eliquis IVF: D5 LR Code: full   Dispo: Admit patient to Inpatient with expected length of stay greater than 2 midnights.  SignedVersie Starks, DO 10/24/2019, 5:23 PM  Pager: [phone]

## 2019-10-24 NOTE — ED Notes (Signed)
Full linen change and brief changed. Pt constantly removing leads and crying. Pt screaming at top of lungs when BP cuff inflates. Pt relaxed with therapeutic communication.

## 2019-10-24 NOTE — ED Provider Notes (Signed)
[facility] EMERGENCY DEPARTMENT Provider Note   CSN: [id number] Arrival date & time: 10/24/19  1127     History No chief complaint on file.   Robin Salazar is a 79 y.o. female.  Level 5 caveat secondary to dementia.  Patient unable to give any history.  I talked to her daughter who she lives with.  Diagnosed with Covid beginning of September.  Was weak and got admitted to [facility] for 3 or 4 days and was discharged about a week ago.  Has not been eating or drinking and not taking much for meds.  Generally declining.   The history is provided by the patient, the EMS personnel and a relative. The history is limited by the condition of the patient.  Weakness Severity:  Moderate Onset quality:  Gradual Duration:  1 week Timing:  Intermittent Progression:  Worsening Chronicity:  New Context: recent infection   Relieved by:  Nothing Worsened by:  Nothing Ineffective treatments:  None tried Associated symptoms: lethargy   Associated symptoms: no abdominal pain and no chest pain        Past Medical History:  Diagnosis Date  . Allergy   . Arthritis   . Asthma    anoro - inhaler  . Cataract    left eye  . CHF (congestive heart failure) (HCC)   . Chronic anticoagulation 09/09/2015  . Clotting disorder (HCC)    anticoagulant - a fib  . COPD (chronic obstructive pulmonary disease) (HCC)   . History of Helicobacter pylori infection   . Hyperlipidemia 11/03/2016  . Hypertension   . Hypertensive heart disease with heart failure (HCC) 09/09/2015  . Persistent atrial fibrillation (HCC) 09/09/2015    Patient Active Problem List   Diagnosis Date Noted  . Epigastric pain 09/28/2017  . Chest pain in adult 11/18/2016  . Chronic diastolic heart failure (HCC) 11/16/2016  . Hyperlipidemia 11/03/2016  . Chronic anticoagulation 09/09/2015  . Hypertensive heart disease with heart failure (HCC) 09/09/2015  . Persistent atrial fibrillation (HCC) 09/09/2015     Past Surgical History:  Procedure Laterality Date  . ABDOMINAL HYSTERECTOMY    . APPENDECTOMY    . CHOLECYSTECTOMY    . COLONOSCOPY    . TONSILLECTOMY       OB History   No obstetric history on file.     Family History  Problem Relation Age of Onset  . Heart disease Mother   . Heart disease Father   . Diabetes Father   . Colon cancer Neg Hx   . Esophageal cancer Neg Hx   . Rectal cancer Neg Hx   . Stomach cancer Neg Hx     Social History   Tobacco Use  . Smoking status: Never Smoker  . Smokeless tobacco: Never Used  Vaping Use  . Vaping Use: Never used  Substance Use Topics  . Alcohol use: No  . Drug use: No    Home Medications Prior to Admission medications   Medication Sig Start Date End Date Taking? Authorizing Provider  albuterol (PROAIR HFA) 108 (90 Base) MCG/ACT inhaler Inhale 1 puff into the lungs every 6 (six) hours as needed for wheezing or shortness of breath.    [provider]  ANORO ELLIPTA 62.5-25 MCG/INH AEPB TAKE 1 PUFF BY MOUTH EVERY DAY 04/16/17   [provider]  apixaban (ELIQUIS) 5 MG TABS tablet Take 5 mg by mouth 2 (two) times daily.    [provider]  CALCIUM PO Take 1 tablet by  mouth daily.    [provider]  Calcium-Vitamin D-Vitamin K (CHEWABLE CALCIUM PO) Take 1 tablet by mouth 2 (two) times daily.    [provider]  cetirizine (ZYRTEC ALLERGY) 10 MG tablet Take 10 mg by mouth daily.    [provider]  Docusate Calcium (STOOL SOFTENER PO) Take 1 tablet by mouth daily.    [provider]  donepezil (ARICEPT) 10 MG tablet Take 10 mg by mouth daily. 11/30/15   [provider]  famotidine (PEPCID) 20 MG tablet Take 1 tablet (20 mg total) by mouth at bedtime. 04/08/19   Lynann Bologna, MD  furosemide (LASIX) 20 MG tablet TAKE 2 TABLETS BY MOUTH EVERY DAY 10/15/19   Baldo Daub, MD  KLOR-CON M20 20 MEQ tablet Take 20 mEq by mouth 2 (two) times daily. 10/02/16    [provider]  memantine (NAMENDA) 5 MG tablet Take 5 mg by mouth 2 (two) times daily. 04/17/19   [provider]  metoprolol tartrate (LOPRESSOR) 25 MG tablet 2 tablets in the Am and 1 tablet in the pm 07/21/16   [provider]  Multiple Vitamins-Minerals (MULTIVITAMIN ADULT) CHEW Chew 1 tablet by mouth 2 (two) times daily.    [provider]  omeprazole (PRILOSEC) 40 MG capsule Take 1 capsule (40 mg total) by mouth daily. 06/20/19   Lynann Bologna, MD  simvastatin (ZOCOR) 40 MG tablet Take 40 mg by mouth daily. 08/31/16   [provider]  sucralfate (CARAFATE) 1 g tablet 1 tablet as directed. Take before each meal 03/31/19   [provider]  telmisartan (MICARDIS) 80 MG tablet Take 80 mg by mouth daily. 08/22/16   [provider]  Vitamin D, Ergocalciferol, (DRISDOL) 50000 units CAPS capsule Take 1 capsule by mouth once a week. Only on Sunday 05/25/16   [provider]    Allergies    Contrast media [iodinated diagnostic agents]  Review of Systems   Review of Systems  Unable to perform ROS: Dementia  Cardiovascular: Negative for chest pain.  Gastrointestinal: Negative for abdominal pain.  Neurological: Positive for weakness.    Physical Exam Updated Vital Signs BP 120/72 (BP Location: Right Arm)   Pulse 76   Temp 98 F (36.7 C) (Oral)   Resp 16   SpO2 100%   Physical Exam Vitals and nursing note reviewed.  Constitutional:      General: She is not in acute distress.    Appearance: Normal appearance. She is well-developed.  HENT:     Head: Normocephalic and atraumatic.  Eyes:     Conjunctiva/sclera: Conjunctivae normal.  Cardiovascular:     Rate and Rhythm: Normal rate and regular rhythm.     Heart sounds: No murmur heard.   Pulmonary:     Effort: Pulmonary effort is normal. No respiratory distress.     Breath sounds: Normal breath sounds.  Abdominal:     Palpations: Abdomen is soft.     Tenderness: There  is no abdominal tenderness.  Musculoskeletal:        General: No deformity or signs of injury. Normal range of motion.     Cervical back: Neck supple.  Skin:    General: Skin is warm and dry.     Capillary Refill: Capillary refill takes less than 2 seconds.  Neurological:     General: No focal deficit present.     Mental Status: She is alert. Mental status is at baseline. She is disoriented.     Comments:  Alert and oriented to self only     ED Results / Procedures / Treatments   Labs (all labs ordered are listed, but only abnormal results are displayed) Labs Reviewed  SARS CORONAVIRUS 2 BY RT PCR (HOSPITAL ORDER, PERFORMED IN [facility]) - Abnormal; Notable for the following components:      Result Value   SARS Coronavirus 2 POSITIVE (*)    All other components within normal limits  COMPREHENSIVE METABOLIC PANEL - Abnormal; Notable for the following components:   Creatinine, Ser 1.02 (*)    Calcium 8.5 (*)    Albumin 2.9 (*)    GFR calc non Af Amer 53 (*)    All other components within normal limits  D-DIMER, QUANTITATIVE (NOT AT [facility]) - Abnormal; Notable for the following components:   D-Dimer, Quant 0.68 (*)    All other components within normal limits  LACTATE DEHYDROGENASE - Abnormal; Notable for the following components:   LDH 250 (*)    All other components within normal limits  FERRITIN - Abnormal; Notable for the following components:   Ferritin 440 (*)    All other components within normal limits  FIBRINOGEN - Abnormal; Notable for the following components:   Fibrinogen 758 (*)    All other components within normal limits  C-REACTIVE PROTEIN - Abnormal; Notable for the following components:   CRP 5.9 (*)    All other components within normal limits  CULTURE, BLOOD (ROUTINE X 2)  CULTURE, BLOOD (ROUTINE X 2)  URINE CULTURE  LACTIC ACID, PLASMA  CBC WITH DIFFERENTIAL/PLATELET  PROCALCITONIN  TRIGLYCERIDES  URINALYSIS, ROUTINE W REFLEX MICROSCOPIC   MAGNESIUM  CBC WITH DIFFERENTIAL/PLATELET  COMPREHENSIVE METABOLIC PANEL    EKG EKG Interpretation  Date/Time:  Thursday October 24 2019 11:31:13 EDT Ventricular Rate:  73 PR Interval:    QRS Duration: 104 QT Interval:  452 QTC Calculation: 499 R Axis:   35 Text Interpretation: Atrial fibrillation Abnormal R-wave progression, early transition Borderline prolonged QT interval No old tracing to compare Confirmed by Meridee ScoreButler, Tanicia Wolaver [id number]) on 10/24/2019 12:04:45 PM   Radiology DG Chest Port 1 View  Result Date: 10/24/2019 CLINICAL DATA:  Weakness. Per EMR, patient tested positive for COVID on 10/18/2019 EXAM: PORTABLE CHEST 1 VIEW COMPARISON:  10/19/2019 FINDINGS: Similar appearance of patchy, peripheral prominent airspace opacities in the right greater than left lungs. No pleural effusions. No pneumothorax. Similar cardiac silhouette, which is mildly enlarged. Apparent double density along the right heart border. No acute osseous abnormality. IMPRESSION: 1. Similar patchy peripheral predominant airspace disease, compatible with multifocal pneumonia and the patient's reported history of COVID. 2. Mild cardiomegaly with possible left atrial enlargement. Electronically Signed   By: Feliberto HartsFrederick S Jones MD   On: 10/24/2019 12:57    Procedures Procedures (including critical care time)  Medications Ordered in ED Medications  sodium chloride 0.9 % bolus 500 mL (has no administration in time range)  metoprolol tartrate (LOPRESSOR) tablet 25 mg (has no administration in time range)  pantoprazole (PROTONIX) EC tablet 40 mg (40 mg Oral Not Given 10/24/19 1629)  sucralfate (CARAFATE) tablet 1 g (1 g Oral Not Given 10/24/19 1722)  apixaban (ELIQUIS) tablet 5 mg (has no administration in time range)  albuterol (VENTOLIN HFA) 108 (90 Base) MCG/ACT inhaler 1 puff (has no administration in time range)  umeclidinium-vilanterol (ANORO ELLIPTA) 62.5-25 MCG/INH 1 puff (1 puff Inhalation Not Given 10/24/19 1629)   acetaminophen (TYLENOL) tablet 650 mg (has no administration in time range)  Or  acetaminophen (TYLENOL) suppository 650 mg (has no administration in time range)  dextrose 5 % in lactated ringers infusion (has no administration in time range)  cefdinir (OMNICEF) capsule 300 mg (has no administration in time range)  irbesartan (AVAPRO) tablet 150 mg (150 mg Oral Not Given 10/24/19 1722)  simvastatin (ZOCOR) tablet 40 mg (has no administration in time range)  polyethylene glycol (MIRALAX / GLYCOLAX) packet 17 g (has no administration in time range)    ED Course  I have reviewed the triage vital signs and the nursing notes.  Pertinent labs & imaging results that were available during my care of the patient were reviewed by me and considered in my medical decision making (see chart for details).  Clinical Course as of Oct 23 1749  Thu Oct 24, 2019  1243 Chest x-ray interpreted by me as likely multifocal infiltrates.  Awaiting radiology reading.   [MB]  1501 Discussed with Romeo Apple from the internal medicine service who will evaluate the patient for admission.   [MB]    Clinical Course User Index [MB] Terrilee Files, MD   MDM Rules/Calculators/A&P                         This patient complains of poor p.o. intake, generalized weakness in the setting of recent Covid infection/pneumonia and UTI; this involves an extensive number of treatment Options and is a complaint that carries with it a high risk of complications and Morbidity. The differential includes worsening pneumonia, UTI, Covid, deconditioning, dehydration, metabolic derangement  I ordered, reviewed and interpreted labs, which included CBC with normal white count normal hemoglobin, chemistries fairly normal other than a low calcium, lactate normal, inflammatory markers mildly elevated, Covid testing positive, urinalysis still pending I ordered medication IV fluids I ordered imaging studies which included chest x-ray and I  independently    visualized and interpreted imaging which showed multifocal pneumonia Additional history obtained from EMS and patient's daughter Previous records obtained and reviewed in epic, unable to see records from [facility] I consulted internal medicine teaching service and discussed lab and imaging findings  Critical Interventions: None  After the interventions stated above, I reevaluated the patient and found patient to be hemodynamically stable.  It sounds like she is declining at home and would be unsafe to return there.  Reviewed this with internal medicine and they will evaluate the patient for admission.  She probably needs some hydration and may need SNF for conditioning.   Final Clinical Impression(s) / ED Diagnoses Final diagnoses:  COVID-19 virus infection  FTT (failure to thrive) in adult    Rx / DC Orders ED Discharge Orders    None       Terrilee Files, MD 10/24/19 1757

## 2019-10-24 NOTE — Progress Notes (Signed)
Patient currently has no IV meds ordered and patient is stating" she does not want it". Attempted to reapply the patient's gown and she removed in and turned to her side. RN Crystal made aware.

## 2019-10-25 DIAGNOSIS — U071 COVID-19: Principal | ICD-10-CM

## 2019-10-25 LAB — CBC WITH DIFFERENTIAL/PLATELET
Abs Immature Granulocytes: 0.06 10*3/uL (ref 0.00–0.07)
Basophils Absolute: 0 10*3/uL (ref 0.0–0.1)
Basophils Relative: 1 %
Eosinophils Absolute: 0.2 10*3/uL (ref 0.0–0.5)
Eosinophils Relative: 4 %
HCT: 38.5 % (ref 36.0–46.0)
Hemoglobin: 12.6 g/dL (ref 12.0–15.0)
Immature Granulocytes: 1 %
Lymphocytes Relative: 15 %
Lymphs Abs: 0.8 10*3/uL (ref 0.7–4.0)
MCH: 31 pg (ref 26.0–34.0)
MCHC: 32.7 g/dL (ref 30.0–36.0)
MCV: 94.8 fL (ref 80.0–100.0)
Monocytes Absolute: 0.9 10*3/uL (ref 0.1–1.0)
Monocytes Relative: 17 %
Neutro Abs: 3.4 10*3/uL (ref 1.7–7.7)
Neutrophils Relative %: 62 %
Platelets: 281 10*3/uL (ref 150–400)
RBC: 4.06 MIL/uL (ref 3.87–5.11)
RDW: 12.6 % (ref 11.5–15.5)
WBC: 5.4 10*3/uL (ref 4.0–10.5)
nRBC: 0 % (ref 0.0–0.2)

## 2019-10-25 LAB — COMPREHENSIVE METABOLIC PANEL
ALT: 16 U/L (ref 0–44)
AST: 24 U/L (ref 15–41)
Albumin: 2.6 g/dL — ABNORMAL LOW (ref 3.5–5.0)
Alkaline Phosphatase: 41 U/L (ref 38–126)
Anion gap: 9 (ref 5–15)
BUN: 13 mg/dL (ref 8–23)
CO2: 29 mmol/L (ref 22–32)
Calcium: 8.1 mg/dL — ABNORMAL LOW (ref 8.9–10.3)
Chloride: 101 mmol/L (ref 98–111)
Creatinine, Ser: 0.75 mg/dL (ref 0.44–1.00)
GFR calc Af Amer: >60 mL/min (ref >60)
GFR calc non Af Amer: >60 mL/min (ref >60)
Glucose, Bld: 115 mg/dL — ABNORMAL HIGH (ref 70–99)
Potassium: 3.6 mmol/L (ref 3.5–5.1)
Sodium: 139 mmol/L (ref 135–145)
Total Bilirubin: 1 mg/dL (ref 0.3–1.2)
Total Protein: 5.7 g/dL — ABNORMAL LOW (ref 6.5–8.1)

## 2019-10-25 LAB — MAGNESIUM: Magnesium: 1.9 mg/dL (ref 1.7–2.4)

## 2019-10-25 LAB — URINE CULTURE: Culture: <10000 — AB

## 2019-10-25 MED ORDER — ENSURE ENLIVE PO LIQD
237.0000 mL | Freq: Two times a day (BID) | ORAL | Status: DC
  Filled 2019-10-25: qty 237

## 2019-10-25 MED ORDER — ENOXAPARIN SODIUM 40 MG/0.4ML ~~LOC~~ SOLN: 40.0000 mg | SUBCUTANEOUS | Status: DC

## 2019-10-25 NOTE — ED Notes (Signed)
Pt resting in bed, does not appear in distress, respirations are even and non-labored Skin is warm,dry and intact Provided extra blankets for comfort

## 2019-10-25 NOTE — ED Notes (Signed)
  Patient upset when entering room and asking where her mother is.  Patient has removed her gown and pulled off cardiac monitor.  Attempted to redirect patient but she is altered.  Placed patient back in gown and on cardiac monitor.  Patient given two warm blankets and encouraged to get some rest.

## 2019-10-25 NOTE — ED Notes (Addendum)
Bladder scan 112mL

## 2019-10-25 NOTE — ED Notes (Signed)
Imagene Riches daughter [phone] would like an update on the pt

## 2019-10-25 NOTE — Discharge Summary (Signed)
Name: Robin Salazar MRN: [id number] DOB: 08-31-1940 79 y.o. PCP: Jim Like, NP  Date of Admission: 10/24/2019 11:27 AM Date of Discharge: 10/29/2019 Attending Physician: Dr. Mayford Knife  Discharge Diagnosis: 1. Failure to Thrive 2/2 Covid-19 2. AKI 3. Persistent Atrial Fibrillation 4. Dementia with Hospital Delirium   Discharge Medications: Allergies as of 10/29/2019      Reactions   Contrast Media [iodinated Diagnostic Agents] Other (See Comments)   Does not remember reaction, but is allergic      Medication List    STOP taking these medications   apixaban 5 MG Tabs tablet Commonly known as: ELIQUIS   cefdinir 300 MG capsule Commonly known as: OMNICEF   CHEWABLE CALCIUM PO   docusate sodium 100 MG capsule Commonly known as: COLACE   furosemide 20 MG tablet Commonly known as: LASIX   Klor-Con M20 20 MEQ tablet Generic drug: potassium chloride SA   metoprolol tartrate 25 MG tablet Commonly known as: LOPRESSOR   simvastatin 40 MG tablet Commonly known as: ZOCOR   STOOL SOFTENER PO   telmisartan 80 MG tablet Commonly known as: MICARDIS     TAKE these medications   Anoro Ellipta 62.5-25 MCG/INH Aepb Generic drug: umeclidinium-vilanterol Inhale 1 puff into the lungs daily.   CALCIUM PO Take 1 tablet by mouth daily.   donepezil 10 MG tablet Commonly known as: ARICEPT Take 10 mg by mouth at bedtime.   famotidine 20 MG tablet Commonly known as: Pepcid Take 1 tablet (20 mg total) by mouth at bedtime.   memantine 5 MG tablet Commonly known as: NAMENDA Take 5 mg by mouth 2 (two) times daily.   Multivitamin Adult Chew Chew 1 tablet by mouth 2 (two) times daily.   omeprazole 40 MG capsule Commonly known as: PRILOSEC Take 1 capsule (40 mg total) by mouth daily. What changed: when to take this   ProAir HFA 108 (90 Base) MCG/ACT inhaler Generic drug: albuterol Inhale 1 puff into the lungs every 6 (six) hours as needed for wheezing or shortness of  breath.   QUEtiapine 25 MG tablet Commonly known as: SEROQUEL Take 25 mg by mouth at bedtime.   sucralfate 1 g tablet Commonly known as: CARAFATE Take 1 tablet by mouth See admin instructions. Take 1 gram by mouth three times a day before meals as needed/AS DIRECTED   Vitamin D (Ergocalciferol) 1.25 MG (50000 UNIT) Caps capsule Commonly known as: DRISDOL Take 50,000 Units by mouth every 14 (fourteen) days.   ZyrTEC Allergy 10 MG tablet Generic drug: cetirizine Take 10 mg by mouth daily.       Disposition and follow-up:   [name] was discharged from [facility] in Stable condition.  At the hospital follow up visit please address:  1. Failure to Thrive 2/2 Covid-19: Not eating since discharge from [facility] 9/7. Aspirated with water. Placed on dysphagia I diet. Discharged with hospice.  2. AKI: pre-renal, resolved with fluids  3. Persistent Atrial Fibrillation: Metoprolol stopped due to no po intake, no RVR.  4. Dementia with Hospital Delirium: hospital delirium with agitation, patient needs delirium precautions and frequent redirection. Treated with olanzapine disintegrating tablet prn when unable to redirect.   2.  Labs / imaging needed at time of follow-up: none  3.  Pending labs/ test needing follow-up: none  Follow-up Appointments:   Hospital Course by problem list:  1. Failure to Thrive 2/2 Covid-19 2. AKI 3. Persistent Atrial Fibrillation 4. Dementia with Hospital Delirium   Robin Salazar  is a 79 yo female with PMH of CHF, afib with RVR on eliquis, COPD, HTN, HLD, advanced dementia who presented with anorexia, FTT, and AKI due to no po intake after recent hospitalization and treatment for bacterial pneumonia from 9/3-9/7 at [facility], also found to be positive for Covid on 9/1. She was treated with monoclonal antibodies 9/3 but never required supplemental oxygen. After discharge she would not eat and remained more agitated and confused  than usual. At baseline she was able to ambulate, eat, and use the restroom at home and was taken care of by her husband who was unable to care for her with her delirium and dementia.  For her AKI she was treated with fluids, and this resolved. She developed worsening agitation and delirium and her lines were removed. She was seen by SLP and refused po intake but eventually drank water, which she aspirated. She was eventually able to be placed on dysphagia I diet but continued to refuse po intake.  Palliative was consulted and after ongoing family discussion, ultimately it was decided she would transition to comfort care and go to home with hospice.  Discharge Vitals:   BP (!) 131/101 (BP Location: Right Arm)   Pulse 99   Temp 97.8 F (36.6 C) (Oral)   Resp 20   Ht 5\' 2"  (1.575 m)   Wt 78.3 kg   SpO2 93%   BMI 31.57 kg/m   Pertinent Labs, Studies, and Procedures:   CXR 10/24/19 IMPRESSION: 1. Similar patchy peripheral predominant airspace disease, compatible with multifocal pneumonia and the patient's reported history of COVID. 2. Mild cardiomegaly with possible left atrial enlargement.   Discharge Instructions: Discharge Instructions    Diet - low sodium heart healthy   Complete by: As directed    Discharge instructions   Complete by: As directed    Increase activity slowly   Complete by: As directed       Signed: 12/24/19, MD 11/02/2019, 3:01 PM   Pager: [phone]

## 2019-10-25 NOTE — Progress Notes (Addendum)
   Subjective:   Patient similar to yesterday, answering yes to every question. No distress. She does sometimes talk about people on the tv but it's difficult to understand what she's saying.   Objective:  Vital signs in last 24 hours: Vitals:   10/24/19 2255 10/25/19 0000 10/25/19 0100 10/25/19 0200  BP: 128/85  (!) 127/114   Pulse: (!) 102 77 71 84  Resp: 17 14  (!) 24  Temp:      TempSrc:      SpO2: 100% 95% 99% 97%  Weight:      Height:       Supplemental Oxygen: 97% on Room Air  Constitution: NAD, supine in bed HENT: [place]/AT Cardio: irregular rate & rhythm, no m/r/g, no LE edema  Respiratory: CTA, no w/r/r Abdominal: no wincing with palpation, soft, non-distended MSK: moving all extremities Neuro: oriented to self only, answers "yes" to all questions Skin: c/d/i   Assessment/Plan:  Active Problems:   COVID-19  79yo female with PMH of CHF, dementia, persistent atrial fibrillation, hypertension, and COPD presenting from home with failure to thrive in the setting of recent covid-19 pneumonia.   Failure to Thrive Secondary to Covid-19 Pneumonia Dementia AKI  Recently discharged from [place] due to covid-19 pneumonia and possible bacterial pneumonia, discharged with 3 more days cefdinir. Respiratory status stable on room air. She has had general decline over the last two weeks, weak and unable to walk on her own and is not eating or drinking. Her husband is 57 and her primary caregiver and is unable to care for her on his own.   - IVF yesterday, appears euvolemic, will f/u pending labs  - SLP, PT, OT  - TOC consulted for assistance with SNF  - continue cefdinir  - UA normal, culture pending  - blood culture pending   CHF Persistent Atrial Fibrillation  HTN  - continue metoprolol, half dose of her ARB - continue eliquis   Dementia  Continue to hold memantine and aricept. Will recommend discontinuation at discharge with advanced dementia as these medications have  no clear benefit, especially at this stage, and do have adverse effects.   VTE: eliquis IVF: none Diet: NPO Code: full   Dispo: Anticipated discharge pending SNF placement.   Tavious Griesinger A, DO 10/25/2019, 7:58 AM Pager: [phone] After 5pm on weekdays and 1pm on weekends: On Call Pager: [phone]

## 2019-10-25 NOTE — Evaluation (Signed)
Clinical/Bedside Swallow Evaluation Patient Details  Name: Robin Salazar MRN: [id number] Date of Birth: 1940-10-03  Today's Date: 10/25/2019 Time: SLP Start Time (ACUTE ONLY): 1400 SLP Stop Time (ACUTE ONLY): 1420 SLP Time Calculation (min) (ACUTE ONLY): 20 min  Past Medical History:  Past Medical History:  Diagnosis Date  . Allergy   . Arthritis   . Asthma    anoro - inhaler  . Cataract    left eye  . CHF (congestive heart failure) (HCC)   . Chronic anticoagulation 09/09/2015  . Clotting disorder (HCC)    anticoagulant - a fib  . COPD (chronic obstructive pulmonary disease) (HCC)   . History of Helicobacter pylori infection   . Hyperlipidemia 11/03/2016  . Hypertension   . Hypertensive heart disease with heart failure (HCC) 09/09/2015  . Persistent atrial fibrillation (HCC) 09/09/2015   Past Surgical History:  Past Surgical History:  Procedure Laterality Date  . ABDOMINAL HYSTERECTOMY    . APPENDECTOMY    . CHOLECYSTECTOMY    . COLONOSCOPY    . TONSILLECTOMY     HPI:  79yo female admitted 10/24/19 with dehydration, poor intake, not taking medications. PMH: CHF, severe dementia, AFib, HTN, HLD, COPD. CXR = Similar patchy peripheral predominant airspace disease, compatible with multifocal pneumonia and the patient's reported history of COVID.   Assessment / Plan / Recommendation Clinical Impression  Limited bedside swallow assessment completed. Pt was awake and alert, speech fully intelligible. She was unable to follow commands for CN exam, but did not exhibit obvious weakness or asymmetry. Pt would not allow oral care, and closed her mouth and turned away from all PO presentations. No family was present to discuss previous diet level or history of any swallowing difficulty. No PO diet can be recommended at this time, as pt was not observed on any texture. Will continue efforts to assess swallow function and safety. RN informed.   SLP Visit Diagnosis: Dysphagia, unspecified  (R13.10)    Aspiration Risk  Risk for inadequate nutrition/hydration    Diet Recommendation NPO   Medication Administration: Via alternative means    Other  Recommendations Oral Care Recommendations: Oral care QID   Follow up Recommendations 24 hour supervision/assistance;Skilled Nursing facility      Frequency and Duration min 1 x/week  1 week;2 weeks       Prognosis Prognosis for Safe Diet Advancement: Fair Barriers to Reach Goals: Cognitive deficits      Swallow Study   General Date of Onset: 10/24/19 HPI: 79yo female admitted 10/24/19 with dehydration, poor intake, not taking medications. PMH: CHF, severe dementia, AFib, HTN, HLD, COPD. CXR = Similar patchy peripheral predominant airspace disease, compatible with multifocal pneumonia and the patient's reported history of COVID. Type of Study: Bedside Swallow Evaluation Previous Swallow Assessment: none Diet Prior to this Study: NPO Temperature Spikes Noted: No Respiratory Status: Room air History of Recent Intubation: No Behavior/Cognition: Alert;Confused;Uncooperative;Doesn't follow directions Oral Cavity Assessment: Within Functional Limits Oral Care Completed by SLP: No Oral Cavity - Dentition: Adequate natural dentition Self-Feeding Abilities: Total assist Patient Positioning: Upright in bed Baseline Vocal Quality: Normal Volitional Cough: Cognitively unable to elicit Volitional Swallow: Unable to elicit    Oral/Motor/Sensory Function Overall Oral Motor/Sensory Function: Within functional limits    Robin Salazar, [facility], CCC-SLP Speech Language Pathologist Office: [phone]  Leigh Aurora 10/25/2019,2:24 PM

## 2019-10-26 ENCOUNTER — Other Ambulatory Visit: Payer: Self-pay | Admitting: Internal Medicine

## 2019-10-26 DIAGNOSIS — N179 Acute kidney failure, unspecified: Secondary | ICD-10-CM | POA: Diagnosis not present

## 2019-10-26 DIAGNOSIS — R69 Illness, unspecified: Secondary | ICD-10-CM | POA: Diagnosis not present

## 2019-10-26 DIAGNOSIS — N390 Urinary tract infection, site not specified: Secondary | ICD-10-CM | POA: Diagnosis not present

## 2019-10-26 DIAGNOSIS — J440 Chronic obstructive pulmonary disease with acute lower respiratory infection: Secondary | ICD-10-CM | POA: Diagnosis not present

## 2019-10-26 DIAGNOSIS — I5032 Chronic diastolic (congestive) heart failure: Secondary | ICD-10-CM | POA: Diagnosis not present

## 2019-10-26 DIAGNOSIS — Z66 Do not resuscitate: Secondary | ICD-10-CM | POA: Diagnosis not present

## 2019-10-26 DIAGNOSIS — J1282 Pneumonia due to coronavirus disease 2019: Secondary | ICD-10-CM | POA: Diagnosis not present

## 2019-10-26 DIAGNOSIS — U071 COVID-19: Secondary | ICD-10-CM | POA: Diagnosis not present

## 2019-10-26 DIAGNOSIS — I4819 Other persistent atrial fibrillation: Secondary | ICD-10-CM | POA: Diagnosis not present

## 2019-10-26 DIAGNOSIS — Z515 Encounter for palliative care: Secondary | ICD-10-CM | POA: Diagnosis not present

## 2019-10-26 LAB — COMPREHENSIVE METABOLIC PANEL
ALT: 16 U/L (ref 0–44)
AST: 21 U/L (ref 15–41)
Albumin: 2.9 g/dL — ABNORMAL LOW (ref 3.5–5.0)
Alkaline Phosphatase: 46 U/L (ref 38–126)
Anion gap: 13 (ref 5–15)
BUN: 13 mg/dL (ref 8–23)
CO2: 25 mmol/L (ref 22–32)
Calcium: 8.4 mg/dL — ABNORMAL LOW (ref 8.9–10.3)
Chloride: 99 mmol/L (ref 98–111)
Creatinine, Ser: 0.9 mg/dL (ref 0.44–1.00)
GFR calc Af Amer: >60 mL/min (ref >60)
GFR calc non Af Amer: >60 mL/min (ref >60)
Glucose, Bld: 104 mg/dL — ABNORMAL HIGH (ref 70–99)
Potassium: 3.5 mmol/L (ref 3.5–5.1)
Sodium: 137 mmol/L (ref 135–145)
Total Bilirubin: 1.1 mg/dL (ref 0.3–1.2)
Total Protein: 6.2 g/dL — ABNORMAL LOW (ref 6.5–8.1)

## 2019-10-26 MED ORDER — DEXTROSE IN LACTATED RINGERS 5 % IV SOLN: INTRAVENOUS | Status: DC

## 2019-10-26 MED ORDER — METOPROLOL TARTRATE 5 MG/5ML IV SOLN
5.0000 mg | Freq: Once | INTRAVENOUS | Status: AC
  Administered 2019-10-26: 5 mg via INTRAVENOUS
  Filled 2019-10-26: qty 5

## 2019-10-26 MED ORDER — METOPROLOL TARTRATE 5 MG/5ML IV SOLN: 5.0000 mg | INTRAVENOUS | Status: DC | PRN

## 2019-10-26 MED ORDER — ENOXAPARIN SODIUM 80 MG/0.8ML ~~LOC~~ SOLN
1.0000 mg/kg | Freq: Two times a day (BID) | SUBCUTANEOUS | Status: DC
  Administered 2019-10-26 – 2019-10-28 (×5): 77.5 mg via SUBCUTANEOUS
  Filled 2019-10-26: qty 0.8
  Filled 2019-10-26 (×3): qty 0.78
  Filled 2019-10-26: qty 0.8
  Filled 2019-10-26: qty 0.78

## 2019-10-26 MED ORDER — HALOPERIDOL LACTATE 5 MG/ML IJ SOLN: 1.0000 mg | Freq: Four times a day (QID) | INTRAMUSCULAR | Status: DC | PRN

## 2019-10-26 MED ORDER — LACTATED RINGERS IV SOLN: INTRAVENOUS | Status: DC

## 2019-10-26 MED ORDER — HALOPERIDOL LACTATE 5 MG/ML IJ SOLN
0.5000 mg | Freq: Once | INTRAMUSCULAR | Status: AC | PRN
  Administered 2019-10-26: 0.5 mg via INTRAMUSCULAR
  Filled 2019-10-26: qty 1

## 2019-10-26 MED ORDER — OLANZAPINE 5 MG PO TBDP
5.0000 mg | ORAL_TABLET | Freq: Every day | ORAL | Status: DC | PRN
  Administered 2019-10-27 – 2019-10-29 (×3): 5 mg via ORAL
  Filled 2019-10-26 (×6): qty 1

## 2019-10-26 NOTE — ED Notes (Signed)
  Went to assess patient and she had removed her PIV.  Attempted to start another PIV and patient became upset, would not hold still, and asked me to stop.  Will attempt again when patient is cooperative.

## 2019-10-26 NOTE — Plan of Care (Signed)
  Problem: Safety: Goal: Ability to remain free from injury will improve Outcome: Progressing   Problem: Education: Goal: Knowledge of General Education information will improve Description: Including pain rating scale, medication(s)/side effects and non-pharmacologic comfort measures Outcome: Not Progressing   Problem: Health Behavior/Discharge Planning: Goal: Ability to manage health-related needs will improve Outcome: Not Progressing   Problem: Activity: Goal: Risk for activity intolerance will decrease Outcome: Not Progressing   Problem: Nutrition: Goal: Adequate nutrition will be maintained Outcome: Not Progressing   Problem: Coping: Goal: Level of anxiety will decrease Outcome: Not Progressing   

## 2019-10-26 NOTE — Progress Notes (Signed)
IV fluids stopped and IV D/C per MD order.

## 2019-10-26 NOTE — Plan of Care (Signed)
  Problem: Safety: Goal: Ability to remain free from injury will improve Outcome: Progressing   Problem: Education: Goal: Knowledge of General Education information will improve Description: Including pain rating scale, medication(s)/side effects and non-pharmacologic comfort measures Outcome: Not Progressing   Problem: Health Behavior/Discharge Planning: Goal: Ability to manage health-related needs will improve Outcome: Not Progressing   Problem: Activity: Goal: Risk for activity intolerance will decrease Outcome: Not Progressing   Problem: Nutrition: Goal: Adequate nutrition will be maintained Outcome: Not Progressing   Problem: Coping: Goal: Level of anxiety will decrease Outcome: Not Progressing

## 2019-10-26 NOTE — ED Notes (Signed)
  Patient refused PO medications.

## 2019-10-26 NOTE — ED Notes (Signed)
  Patient sitting on side of the bed upset stating that she wants to see her mother and for me to find her.  Patient was redirected in bed and given warm blankets.

## 2019-10-26 NOTE — Progress Notes (Signed)
Subjective:   She was unable to complete SLP eval yesterday as she refused any po intake. Discussed with family yesterday and they state she was refusing to eat or drink most of the time, but when she did drink or eat, she had no difficulty with swallowing or cough. Patient allowed to have some water and immediately aspirated with cough. Placed back on NPO.   She pulled out her IV line last night and is distraught on rounds this morning. She is crying and is asking for her mom. She is still unable to answer most questions but denies pain. She is able to be comforted in the immediate moment but this only lasts for one minute before she begins trying to pull off lines and becomes emotionally distressed again.   Was able to get in touch with her husband today. He states she was diagnosed with Alzheimer's around 5 years ago but was having memory issues prior to that, he cannot remember how long exactly. At her normal baseline she is able to walk around the house, use the restroom on her own, and usually knows where she is. She often confuses people and thinks they are someone else but is usually able to carry on a conversation. She has been unable to do this since developing covid. She passed out at home and then was brought to [place]. Since discharge she has barely eaten or drank anything. He is not sure if they did a CT scan of her head.   We had a long discussion about her current distress, goals of care and whether they had discussed what she would want long term. They have not discussed these things before.   Objective:  Vital signs in last 24 hours: Vitals:   10/25/19 2327 10/26/19 0052 10/26/19 0400 10/26/19 0731  BP: 136/60 131/67 124/66 128/85  Pulse:  (!) 102 80 96  Resp:  (!) 21 15   Temp: 98.1 F (36.7 C) 98.6 F (37 C) 98.7 F (37.1 C) (!) 97.4 F (36.3 C)  TempSrc: Axillary Axillary Temporal Axillary  SpO2:  100% 100%   Weight:      Height:        Constitution: sitting up  in chair, tearful, wearing mittens HENT: [place]/AT Cardio: irregular rate & rhythm, no LE edema  Respiratory: CTA, no w/r/r Abdominal: NTTP, soft, non-distended MSK: moving all extremities Neuro: oriented to self only, more conversational today but continues to be confused and distressed Skin: c/d/i   Assessment/Plan:  Active Problems:   COVID-19  79yo female with PMH of CHF, dementia, persistent atrial fibrillation, hypertension, and COPD presenting from home with failure to thrive in the setting of recent covid-19 pneumonia.   Failure to Thrive Secondary to Covid-19 Pneumonia Dementia with hospital delirium AKI  Recently discharged from [place] due to covid-19 pneumonia and possible bacterial pneumonia, discharged with 3 more days cefdinir. Covid test first positive 9/1. She has been asymptomatic here, and I think we can go by this lab value for her quarantine status. She has had general decline over the last two weeks, weak and unable to walk on her own and is not eating or drinking. Her husband is 77 and her primary caregiver and is unable to care for her on his own.   - will try to obtain covid test results from [place]. She is asymptomatic and if able to come off precautions on the 14th, I think this would benefit her delirium and distress.  - PT, OT  -  TOC consulted for assistance with SNF  - UA normal, culture negative - blood culture NGTD 2 days  Aspiration Unable to complete SLP yesterday. Family endorsed she had no difficulty with swallowing or coughing with eating but patient aspirated when given diet. Talking with husband she has eaten maybe once since hospital discharge since 9/7.  - NPO - f/u SLP  - D5+LR 75 cc/hr  - f/u BMP  - switch out po medications  CHF Persistent Atrial Fibrillation  HTN  Heart rate up to 160s with acute agitation. Once I was able to calm her a bit, heart rate decreased to low 100-115. She has not had her metoprolol since 9/9. Blood pressure  currently normotensive.   - lopressor 5mg  IV once, then prn >120. I do not know if she has HFrEF or HFpEF as we do not have her records and would like to avoid starting dilt drip for this reason and as she is pulling out lines  - unable to take po, switch to lovenox for afib - stop arb  Dementia with hospital delirium She is having increased agitation overnight, which has been intermittent since arriving to the hospital likely secondary to her dementia and being in a new environment. She also may be having withdrawal symptoms from her aricept and memantine as she has not been taking her medications at home. I would like to stop these at discharge but preferable to taper.  She is able to be somewhat comforted but immediately pulls at lines again, becomes distressed with HR climbing to the 160s, and pulled her IV out last night. Will try to minimize lines. She takes Seroquel at home as well but is unable to swallow currently.  Long discussion with her husband about goals of care and what she might want.   - 1 to 1 sitter ordered - haldol .5 mg prn once. Please attempt to redirect first.  - avoid physical restraints  Goals of Care Long discussion with patient's husband about options if she continues to refuse po intake, that this may be secondary to worsening dementia as this can be a symptom but also may be present in the setting of her recent illness or if she is having difficulty swallowing for some reason. I explained that if she continues to not eat, normally an NG tube, then possibly PEG tube would be considered. With her advanced dementia we discussed that both of these modalities would likely be uncomfortable and distressing for her and that he and the family can consider what she might want in terms of balancing treatment and comfort, which he stated he understood. He has not spoken with his wife about this previously. He also understands that she may not return to her previous level of  autonomy, and he agrees that he likely will not be able to take care of her in the future. Similar discussion was had with his daughter as well. We will see how she does with SLP eval, obtain records from [place], and discuss more tomorrow.   VTE: eliquis IVF: none Diet: NPO Code: full   Dispo: Anticipated discharge pending medical work-up.   Loann Chahal A, DO 10/26/2019, 8:43 AM Pager: [phone] After 5pm on weekdays and 1pm on weekends: On Call Pager: [phone]

## 2019-10-26 NOTE — Evaluation (Signed)
Physical Therapy Evaluation Patient Details Name: Michiko Lineman MRN: [id number] DOB: 1940-06-14 Today's Date: 10/26/2019   History of Present Illness  79 yo female with PMH of CHF, dementia, persistent atrial fibrillation, HTN, and COPD presenting from home due to not eating or taking medications. Tested + for COVID 9/1. Hospitalized at [facility] 9/4-9/7.    Clinical Impression  Pt admitted with above diagnosis. PTA pt lived at home with her husband, who was her primary caretaker due to dementia. She was able to perform basic ADLs and household mobility independently. On eval, she required +2 mod assist transfers. Mobility/session very limited by advanced dementia. Pt moaning and tearful but unable to verbalize what is wrong. Rambling/nonsensical verbalization. She is anxious, restless, and impulsive. Following simple commands < 25% of trials. Pt will benefit from skilled PT to increase their independence and safety with mobility to allow discharge to the venue listed below.       Follow Up Recommendations SNF;Supervision/Assistance - 24 hour    Equipment Recommendations  Other (comment) (TBD)    Recommendations for Other Services       Precautions / Restrictions Precautions Precautions: Fall      Mobility  Bed Mobility               General bed mobility comments: Pt received in recliner.  Transfers Overall transfer level: Needs assistance Equipment used: 1 person hand held assist Transfers: Sit to/from [employer] Sit to Stand: Mod assist Stand pivot transfers: Mod assist;+2 physical assistance       General transfer comment: continual verbal/tactile cues to initate mobility, sequence, and stay on task. Performed pivot transfer recliner >bed>recliner. Pt very anxious and impulsive.  Ambulation/Gait             General Gait Details: unable to safely progress  Stairs            Wheelchair Mobility    Modified Rankin (Stroke  Patients Only)       Balance Overall balance assessment: Needs assistance Sitting-balance support: Feet supported;No upper extremity supported Sitting balance-Leahy Scale: Fair     Standing balance support: Bilateral upper extremity supported;During functional activity Standing balance-Leahy Scale: Poor Standing balance comment: reliant on external support                             Pertinent Vitals/Pain Pain Assessment: Faces Faces Pain Scale: Hurts little more Pain Location: generalized, unable to state Pain Descriptors / Indicators: Crying;Moaning;Grimacing Pain Intervention(s): Monitored during session;Limited activity within patient's tolerance;Repositioned    Home Living Family/patient expects to be discharged to:: Skilled nursing facility Living Arrangements: Spouse/significant other                    Prior Function Level of Independence: Needs assistance   Gait / Transfers Assistance Needed: independent household mobility without AD prior to COVID.  ADL's / Homemaking Assistance Needed: independent basic ADLs prior to COVID        Hand Dominance        Extremity/Trunk Assessment   Upper Extremity Assessment Upper Extremity Assessment: Generalized weakness    Lower Extremity Assessment Lower Extremity Assessment: Generalized weakness       Communication   Communication: Expressive difficulties;Receptive difficulties  Cognition Arousal/Alertness: Awake/alert Behavior During Therapy: Anxious;Restless;Impulsive Overall Cognitive Status: No family/caregiver present to determine baseline cognitive functioning  General Comments: Severe dementia at baseline. Unable to appropriately responsed to any questions. Moaning and crying but unable to state what's wrong. Following simple commands < 25% of trials.      General Comments General comments (skin integrity, edema, etc.): VSS on RA     Exercises     Assessment/Plan    PT Assessment Patient needs continued PT services  PT Problem List Decreased strength;Decreased mobility;Decreased safety awareness;Decreased activity tolerance;Decreased cognition;Pain;Decreased balance;Decreased knowledge of use of DME       PT Treatment Interventions DME instruction;Therapeutic activities;Cognitive remediation;Gait training;Therapeutic exercise;Patient/family education;Balance training;Functional mobility training    PT Goals (Current goals can be found in the Care Plan section)  Acute Rehab PT Goals Patient Stated Goal: unable to state PT Goal Formulation: Patient unable to participate in goal setting Time For Goal Achievement: 11/09/19 Potential to Achieve Goals: Fair    Frequency Min 2X/week   Barriers to discharge        Co-evaluation               AM-PAC PT "6 Clicks" Mobility  Outcome Measure Help needed turning from your back to your side while in a flat bed without using bedrails?: A Little Help needed moving from lying on your back to sitting on the side of a flat bed without using bedrails?: A Lot Help needed moving to and from a bed to a chair (including a wheelchair)?: A Lot Help needed standing up from a chair using your arms (e.g., wheelchair or bedside chair)?: A Lot Help needed to walk in hospital room?: Total Help needed climbing 3-5 steps with a railing? : Total 6 Click Score: 11    End of Session   Activity Tolerance: Other (comment) (limited by dementia) Patient left: in chair;with call bell/phone within reach;with chair alarm set Nurse Communication: Mobility status PT Visit Diagnosis: Unsteadiness on feet (R26.81);Muscle weakness (generalized) (M62.81);Difficulty in walking, not elsewhere classified (R26.2);Other abnormalities of gait and mobility (R26.89)    Time: 1517-6160 PT Time Calculation (min) (ACUTE ONLY): 16 min   Charges:   PT Evaluation $PT Eval Moderate Complexity: 1 Mod           Aida Raider, PT  Office # [phone] Pager [phone]   Ilda Foil 10/26/2019, 2:34 PM

## 2019-10-26 NOTE — Evaluation (Addendum)
Clinical/Bedside Swallow Evaluation Patient Details  Name: Robin Salazar MRN: [id number] Date of Birth: 11-10-1940  Today's Date: 10/26/2019 Time: SLP Start Time (ACUTE ONLY): 1400 SLP Stop Time (ACUTE ONLY): 1415 SLP Time Calculation (min) (ACUTE ONLY): 15 min  Past Medical History:  Past Medical History:  Diagnosis Date  . Allergy   . Arthritis   . Asthma    anoro - inhaler  . Cataract    left eye  . CHF (congestive heart failure) (HCC)   . Chronic anticoagulation 09/09/2015  . Clotting disorder (HCC)    anticoagulant - a fib  . COPD (chronic obstructive pulmonary disease) (HCC)   . History of Helicobacter pylori infection   . Hyperlipidemia 11/03/2016  . Hypertension   . Hypertensive heart disease with heart failure (HCC) 09/09/2015  . Persistent atrial fibrillation (HCC) 09/09/2015   Past Surgical History:  Past Surgical History:  Procedure Laterality Date  . ABDOMINAL HYSTERECTOMY    . APPENDECTOMY    . CHOLECYSTECTOMY    . COLONOSCOPY    . TONSILLECTOMY     HPI:  [name] is a 79 yo female with PMH of CHF, dementia, persistent atrial fibrillation, HTN, and COPD presenting from home due to not eating or taking medications. She has severe dementia and was unable to provide history and was only oriented to self. History was provided by her daughter.   Per her daughter she started to have symptoms of worsening confusion compared to her baseline.  She was refusing to take medications. She went to her doctor and was diagnosed with a UTI and started on antibiotics. She went back to the doctor's on 9/1 and was diagnosed with covid, and was then admitted to [facility] on 9/4 due to increasing weakness and new supplemental oxygen requirement. She was discharged on 9/7 but since she has gotten home she has had trouble making it to the restroom on her own, been very week, and is not eating or drinking anything. She did receive what sounds like a monoclonal antibody infusion on  9/3.   The patient's primary caregiver is her husband, whom she lives with alone. The patient is usually able to walk on her own but cannot take care of regular IADLs due to her dementia. Her daughter thinks she was diagnosed with dementia around 2.5 years ago but is not certain.   She states her mom has not indicated that she has been in any pain, no cough, she is making urine, has been afebrile. She is not sure when she last had a BM.   Most recent chest xray was showing similar patchy peripheral predmoninant airspace disease compatible with multifocal PNA and the patient's reported history of COVID.     Assessment / Plan / Recommendation Clinical Impression  ST follow up to assess for PO readiness.  Darnelle Catalan, DO notified this therapist this AM that per family the patient was eating/drinking without obvious issues but intake had fallen off.  However, she noted her to have a cough response on water this morning.  The patient was very confused and unable to follow any commands.  She was speaking but often it was not in reference to anything that was actively going on in the room.  Cranial never exam was not completed.  She allowed one ice chip to be placed in her mouth which she never made any attempt to swallow.  There was initially some oral manipulation which then stopped and a pharyngeal swallow was not triggered.  Attempted  to present her with water via spoon and straw as well as pureed material and she declined any further intake.  She presents with a cognitive based dysphagia.  Given performance on this exam unable to make diet recommendation.  ST will continue to follow for PO readiness.   SLP Visit Diagnosis: Dysphagia, unspecified (R13.10)    Aspiration Risk  Risk for inadequate nutrition/hydration    Diet Recommendation   NPO  Medication Administration: Via alternative means    Other  Recommendations Oral Care Recommendations: Oral care QID   Follow up Recommendations 24 hour  supervision/assistance;Skilled Nursing facility      Frequency and Duration min 1 x/week  2 weeks       Prognosis Prognosis for Safe Diet Advancement: Fair Barriers to Reach Goals: Cognitive deficits      Swallow Study   General Date of Onset: 10/24/19 HPI: [name] is a 79 yo female with PMH of CHF, dementia, persistent atrial fibrillation, HTN, and COPD presenting from home due to not eating or taking medications. She has severe dementia and was unable to provide history and was only oriented to self. History was provided by her daughter.   Per her daughter she started to have symptoms of worse confusion than her baseline, refusing to take medications. She went to her doctor and was diagnosed with a UTI and started on antibiotics. She went back to the doctor's on 9/1 and was diagnosed with covid, and was then admitted to [facility] on 9/4 due to increasing weakness and new supplemental oxygen requirement. She was discharged on 9/7 but since she has gotten home she has had trouble making it to the restroom on her own, been very week, and is not eating or drinking anything. She did receive what sounds like a monoclonal antibody infusion on 9/3.   The patient's primary caregiver is her husband, whom she lives with alone. The patient is usually able to walk on her own but cannot take care of regular IADLs due to her dementia. Her daughter thinks she was diagnosed with dementia around 2.5 years ago but is not certain.   She states her mom has not indicated that she has been in any pain, no cough, she is making urine, has been afebrile. She is not sure when she last had a BM.   Most recent chest xray was showing similar patchy peripheral predmoninant airspace disease compatible with multifocal PNA and the patient's reported history of COVID.   Type of Study: Bedside Swallow Evaluation Previous Swallow Assessment: none Diet Prior to this Study: NPO Temperature Spikes Noted: No Respiratory  Status: Room air History of Recent Intubation: No Behavior/Cognition: Alert;Confused;Agitated;Doesn't follow directions;Uncooperative Oral Cavity Assessment: Other (comment) (Unable to assess) Oral Care Completed by SLP: No Oral Cavity - Dentition: Adequate natural dentition Vision: Impaired for self-feeding Self-Feeding Abilities: Total assist Patient Positioning: Upright in chair Baseline Vocal Quality: Normal Volitional Cough: Cognitively unable to elicit Volitional Swallow: Unable to elicit    Oral/Motor/Sensory Function Overall Oral Motor/Sensory Function: Other (comment) (unable to assess)   Ice Chips Ice chips: Impaired Presentation: Spoon Oral Phase Impairments: Poor awareness of bolus Oral Phase Functional Implications: Oral holding;Oral residue Pharyngeal Phase Impairments: Other (comments) (pt never moved material into pharynx)   Thin Liquid Thin Liquid: Not tested    Nectar Thick Nectar Thick Liquid: Not tested   Honey Thick Honey Thick Liquid: Not tested   Puree Puree: Not tested   Solid     Solid: Not tested  Dimas Aguas, MA, CCC-SLP Acute Rehab SLP [phone]  Fleet Contras 10/26/2019,2:43 PM

## 2019-10-27 DIAGNOSIS — Z66 Do not resuscitate: Secondary | ICD-10-CM

## 2019-10-27 DIAGNOSIS — Z7189 Other specified counseling: Secondary | ICD-10-CM

## 2019-10-27 DIAGNOSIS — Z515 Encounter for palliative care: Secondary | ICD-10-CM

## 2019-10-27 MED ORDER — HALOPERIDOL LACTATE 5 MG/ML IJ SOLN
1.0000 mg | Freq: Once | INTRAMUSCULAR | Status: AC
  Administered 2019-10-27: 1 mg via INTRAMUSCULAR
  Filled 2019-10-27: qty 1

## 2019-10-27 NOTE — Progress Notes (Signed)
  Speech Language Pathology Treatment: Dysphagia  Patient Details Name: Robin Salazar MRN: [id number] DOB: August 04, 1940 Today's Date: 10/27/2019 Time: 9030-0923 SLP Time Calculation (min) (ACUTE ONLY): 15 min  Assessment / Plan / Recommendation Clinical Impression  Robin Salazar was cooperative and accepted multiple sips water but only one bite pudding immediately after which she gagged, declining additional solids. Initial straw sip resulted in immediate mild cough and remainder of consecutive sips water were unremarkable. She became distracted/anxious and began crying. Therapist provided visual/tactile cues to distract and focus attention to po's. There are no upper teeth (dentures?) and puree, thin recommended, crush meals, full supervision and will upgrade when/if able.    HPI HPI: 79yo female admitted 10/24/19 with dehydration, poor intake, not taking medications. PMH: CHF, severe dementia, AFib, HTN, HLD, COPD. CXR = Similar patchy peripheral predominant airspace disease, compatible with multifocal pneumonia and the patient's reported history of COVID      SLP Plan  Continue with current plan of care       Recommendations  Diet recommendations: Dysphagia 1 (puree);Thin liquid Liquids provided via: Cup;Straw Medication Administration: Crushed with puree Supervision: Staff to assist with self feeding;Full supervision/cueing for compensatory strategies Compensations: Minimize environmental distractions;Slow rate;Small sips/bites Postural Changes and/or Swallow Maneuvers: Seated upright 90 degrees                Oral Care Recommendations: Oral care BID Follow up Recommendations: 24 hour supervision/assistance;Skilled Nursing facility SLP Visit Diagnosis: Dysphagia, unspecified (R13.10) Plan: Continue with current plan of care       GO                Robin Salazar 10/27/2019, 4:31 PM  Robin Salazar M.Ed Nurse, children's [phone] Office  [phone]

## 2019-10-27 NOTE — Progress Notes (Signed)
   Subjective:   Initially seen while sleeping and let her rest. Returned and she was much calmer this morning than yesterday but continues to be tearful. She is again asking for water.   Discussed with nursing that she is making urine.   Objective:  Vital signs in last 24 hours: Vitals:   10/26/19 0731 10/26/19 1221 10/27/19 0500 10/27/19 0534  BP: 128/85 131/82 (!) 151/89   Pulse: 96  92   Resp:  19 18   Temp: (!) 97.4 F (36.3 C) 97.8 F (36.6 C) 97.9 F (36.6 C)   TempSrc: Axillary Axillary Oral   SpO2:  94% 98%   Weight:    78.3 kg  Height:        Constitution: supine in bed, tearful but cooperative HENT: [place]/AT Cardio: irregular rate & rhythm, no LE edema  Respiratory: CTA, no w/r/r MSK: moving all extremities Neuro: oriented to self only, tearful but calm Skin: c/d/i   Assessment/Plan:  Active Problems:   COVID-19  79yo female with PMH of CHF, dementia, persistent atrial fibrillation, hypertension, and COPD presenting from home with failure to thrive in the setting of recent covid-19 pneumonia.   Failure to Thrive Secondary to Covid-19 Pneumonia Dementia with hospital delirium AKI - resolved Slightly less agitated today although she continues to be tearful, she is no longer tachycardic. Has been NPO since admission and eating little the two days before that. Tested positive 9/1 and is asymptomatic for covid here. I am hoping that with removal of lines she may be encouraged to work with SLP and we can see if we can establish a diet. Palliative was consulted to discuss long term goals and plans with the family due to her advanced dementia with FTT and anorexia. Goals of care discussions started yesterday, are in my note from 9/11.   - f/u palliative, appreciate recommendations and discussion with family  - continue to avoid lines, continue delirium precautions - zyprexa 5 mg daily prn, try to redirect if possible - please avoid physical restraints - 1 to 1 sitter  ordered but due to shortage have been unable to obtain this.  - will try to obtain covid test results from [place].  - PT, OT  - TOC consulted for assistance with SNF   Aspiration Unable to complete SLP yesterday but observed coughing and vomited once when she had water yesterday. She is asking for water again today. She received about 600 cc of fluid, which was stopped to try and minimize her delirium and agitation.   - NPO - f/u SLP  - am BMP   CHF Persistent Atrial Fibrillation  HTN  Heart rate improved today, no longer tachycardic. Telemetry has been discontinued to try and help with her delirium.   - continue routine vital signs to monitor for RVR  - unable to take po, switch to lovenox for afib - stop arb   VTE: lovenox IVF: none Diet: NPO Code: full   Dispo: Anticipated discharge pending medical work-up.   Margie Urbanowicz A, DO 10/27/2019, 7:31 AM Pager: [phone] After 5pm on weekdays and 1pm on weekends: On Call Pager: [phone]

## 2019-10-27 NOTE — Evaluation (Signed)
Occupational Therapy Evaluation Patient Details Name: Robin Salazar MRN: [id number] DOB: 09-19-1940 Today's Date: 10/27/2019    History of Present Illness 79 yo female with PMH of CHF, dementia, persistent atrial fibrillation, HTN, and COPD presenting from home due to not eating or taking medications. Tested + for COVID 9/1. Hospitalized at [facility] 9/4-9/7.   Clinical Impression   This 79 y/o female presents with the above. PTA pt residing at home and performing mobility and basic ADL tasks without assist. Pt with notable confusion, emotionally labile and intermittently agitated. Pt had just returned to bed with assist of NTs prior to session start, pt initially very calm and participatory in simple grooming ADL. With exiting and returning to room pt notably more restless and confused/crying. Required max multimodal cues for redirection and very difficult to redirect despite multiple methods (music, singing). NT present end of session to assist and sit with pt, pt appears more settled by end of session with continued measures made to comfort pt. Given cognition pt overall totalA for ADL today. She will benefit from continued acute OT services - would likely benefit most from returning home to familiar environment if pt is able to have necessary supervision/assist. If family unable to assist may need to consider SNF.     Follow Up Recommendations  Supervision/Assistance - 24 hour;Home health OT;SNF (HH vs SNF pending family assist )    Equipment Recommendations  3 in 1 bedside commode           Precautions / Restrictions Precautions Precautions: Fall Restrictions Weight Bearing Restrictions: No      Mobility Bed Mobility Overal bed mobility: Needs Assistance Bed Mobility: Supine to Sit;Sit to Supine     Supine to sit: Min assist Sit to supine: Min assist   General bed mobility comments: pt easily able to transition herself to long sitting with assistance provided for  safety   Transfers                 General transfer comment: felt unsafe to attempt with only +1 assist given poor command follow and pt very emotionally labile; pt had just returned to bed with assist of NTs'    Balance                                           ADL either performed or assessed with clinical judgement   ADL Overall ADL's : Needs assistance/impaired Eating/Feeding: NPO   Grooming: Wash/dry face;Minimal assistance;Bed level                                 General ADL Comments: pt otherwise totalA, moreso due to cognition at this time vs physical inability      Vision         Perception     Praxis      Pertinent Vitals/Pain Pain Assessment: Faces Faces Pain Scale: Hurts little more Pain Location: generalized, unable to state Pain Descriptors / Indicators: Crying Pain Intervention(s): Limited activity within patient's tolerance;Monitored during session     Hand Dominance     Extremity/Trunk Assessment Upper Extremity Assessment Upper Extremity Assessment: Generalized weakness;Difficult to assess due to impaired cognition           Communication Communication Communication: Expressive difficulties;Receptive difficulties   Cognition Arousal/Alertness: Awake/alert Behavior During Therapy: Agitated;Anxious;Restless (crying)  Overall Cognitive Status: No family/caregiver present to determine baseline cognitive functioning                                 General Comments: pt with dementia at baseline, worsening confusion as session progressed, crying, unable to be redirected despite multiple methods (soft music, singing familiar songs), minimal command follow    General Comments       Exercises     Shoulder Instructions      Home Living Family/patient expects to be discharged to:: Skilled nursing facility Living Arrangements: Spouse/significant other                                       Prior Functioning/Environment Level of Independence: Needs assistance  Gait / Transfers Assistance Needed: independent household mobility without AD prior to COVID. ADL's / Homemaking Assistance Needed: independent basic ADLs prior to COVID            OT Problem List: Decreased strength;Decreased range of motion;Decreased activity tolerance;Decreased cognition;Cardiopulmonary status limiting activity      OT Treatment/Interventions: Self-care/ADL training;Therapeutic exercise;Energy conservation;DME and/or AE instruction;Therapeutic activities;Cognitive remediation/compensation;Patient/family education;Balance training    OT Goals(Current goals can be found in the care plan section) Acute Rehab OT Goals Patient Stated Goal: unable to state OT Goal Formulation: With patient Time For Goal Achievement: 11/10/19 Potential to Achieve Goals: Fair  OT Frequency: Min 2X/week   Barriers to D/C:            Co-evaluation              AM-PAC OT "6 Clicks" Daily Activity     Outcome Measure Help from another person eating meals?: Total Help from another person taking care of personal grooming?: A Lot Help from another person toileting, which includes using toliet, bedpan, or urinal?: Total Help from another person bathing (including washing, rinsing, drying)?: Total Help from another person to put on and taking off regular upper body clothing?: Total Help from another person to put on and taking off regular lower body clothing?: Total 6 Click Score: 7   End of Session Nurse Communication: Mobility status  Activity Tolerance: Treatment limited secondary to agitation Patient left: in bed;with call bell/phone within reach;with bed alarm set;with nursing/sitter in room (NT in room)  OT Visit Diagnosis: Other symptoms and signs involving cognitive function;Muscle weakness (generalized) (M62.81)                Time: 5361-4431 OT Time Calculation (min): 30 min Charges:   OT General Charges $OT Visit: 1 Visit OT Evaluation $OT Eval Moderate Complexity: 1 Mod OT Treatments $Self Care/Home Management : 8-22 mins  Marcy Siren, OT Acute Rehabilitation Services Pager [phone] Office [phone]   Orlando Penner 10/27/2019, 4:50 PM

## 2019-10-27 NOTE — Progress Notes (Signed)
Initial Nutrition Assessment  DOCUMENTATION CODES:   Not applicable  INTERVENTION:   Recommend consideration of artifical nutrition, if aligns with goals of care. Given delerium with dementia, this may not be an appropriate option. Noted pt has been pulling out all IVs and is very distressed. Cortrak preferred over TPN, will depend on ability to place access.  Await poc  If diet advanced, plan to add Ensure Enlive po BID, each supplement provides 350 kcal and 20 grams of protein and/or Magic cup TID with meals, each supplement provides 290 kcal and 9 grams of protein   NUTRITION DIAGNOSIS:   Inadequate oral intake related to lethargy/confusion, poor appetite, acute illness as evidenced by NPO status, per patient/family report.  GOAL:   Patient will meet greater than or equal to 90% of their needs  MONITOR:   Diet advancement, PO intake, Supplement acceptance  REASON FOR ASSESSMENT:   Malnutrition Screening Tool    ASSESSMENT:   79 yo female admitted with FTT, COVID-19 pneumonia, delerium with hx of dementia, AKI. PMH includes HTN, CHF, COPD, dementia   9/01 COVID-19+ dx, admit to [place] with pneumonia 9/09 Admitted to [facility]  Unable to obtain any history from patient given delerium. 1:1 sitter. Pt has been pulling out all lines.   Per chart review, pt with general decline over the past 2 weeks. Pt has been unable to walk on her own and is not eating or drinking. Husband reporting that pt has eaten maybe once since being discharged from [facility] on 9/7  NPO, unable to participate with SLP due to delerium  Normally nutrition support would be recommended but given pt's delerium with hx of dementia this may not be an appropriate option. Noted MD has been discussing artificial nutrition with patient's family, GOC still pending.   Current wt 78.3 kg; based on weight encounters pt has experienced weight loss. 7% wt loss   Labs: reviewed Meds: reviewed   Diet Order:    Diet Order            Diet NPO time specified  Diet effective now                 EDUCATION NEEDS:   Not appropriate for education at this time  Skin:  Skin Assessment: Reviewed RN Assessment  Last BM:  PTA  Height:   Ht Readings from Last 1 Encounters:  10/24/19 5\' 2"  (1.575 m)    Weight:   Wt Readings from Last 1 Encounters:  10/27/19 78.3 kg     BMI:  Body mass index is 31.57 kg/m.  Estimated Nutritional Needs:   Kcal:  1700-1900 kcals  Protein:  85-95 g  Fluid:  >/= 1.7 L   12/27/19 MS, RDN, LDN, CNSC Registered Dietitian III Clinical Nutrition RD Pager and On-Call Pager Number Located in [place]

## 2019-10-27 NOTE — Progress Notes (Signed)
Patient is transferred to 5N25 per order. Report was given to the nurse who is going to receive the patient.

## 2019-10-27 NOTE — Consult Note (Signed)
Palliative Medicine Inpatient Consult Note  Reason for consult:  Goals of Care "FTT, not eating, advanced dementia"  HPI:  Per intake H&P --> Robin Salazar is a 79 yo female with PMH of CHF, dementia, persistent atrial fibrillation, HTN, and COPD presenting from home due to not eating or taking medications. She has severe dementia and was unable to provide history and was only oriented to self. History was provided by her daughter.  Palliative care was asked to get involved in the setting of FTT.  Clinical Assessment/Goals of Care: I have reviewed medical records including EPIC notes, labs and imaging, received report from bedside RN, assessed the patient who was disoriented and asking for whatever - perseverant.    I called Evette Cristal and Merril Abbe to further discuss diagnosis prognosis, GOC, EOL wishes, disposition and options.   I introduced Palliative Medicine as specialized medical care for people living with serious illness. It focuses on providing relief from the symptoms and stress of a serious illness. The goal is to improve quality of life for both the patient and the family.  I asked Robin Salazar and Misty Stanley to tell me about Robin Salazar - She is from [place], [place]. She is married to Sturgis they had been wed for > 60 years. They share a son and a daughter. She use to work in Designer, fashion/clothing. She enjoys getting out of her home and going shopping. She has two dogs who she adores - [place]ibe and [place]. She is a woman of faith and practices within the [employer] denomination.  Chelesa had been living with her husband independently. She was able to perform most bADLs though her daughter, Misty Stanley set up her medications and helped provide meals for her. She did not use any assistive aids.   A detailed discussion was had today regarding advanced directives - there are none noted to be in [place].    Concepts specific to code status, artifical feeding and hydration, continued IV antibiotics and  rehospitalization was had. We reviewed that in the event of a code, Eilah would likely endure a degree of trauma and the benefit would not outweigh the burden. Shared that resuscitating her will not fix her underlying medical conditions. Patients husband agreed with this and she was made a DNAR/DNI.  We discussed Chevy's failure to thrive and dysphagia in the setting of COVID-19 PNA. I shared with Robin Salazar that in patient with an underlying history of dementia the decline can be marked and often the FTT does not resolve in time. We talked about allowing her the next few days to see if she improves though if she does not it would be reasonable to discuss hospice.  I described hospice as a service for patients for have a life expectancy of < 6 months. It preserves dignity and quality at the end phases of life. The focus changes from curative to symptom relief. Robin Salazar shared, "I don't like it, but if that's what it comes to I understand."  I recommended against NGT/G-tube feedings --> Health care professionals commonly rely on feeding tubes to supply nutrition to these severely demented patients. However, various studies have not shown use of feeding tubes to be effective in preventing malnutrition. Furthermore, they have not been demonstrated to prevent the occurrence or increase the healing of pressure sores, prevent aspiration pneumonia, provide comfort, improve functional status, or extend life. High complication rates, increased use of restraints, and other adverse effects further increase the burden of feeding tubes in severely demented patients.   The  difference between a aggressive medical intervention path  and a palliative comfort care path for this patient at this time was had. Values and goals of care important to patient and family were attempted to be elicited  Discussed the importance of continued conversation with family and their  medical providers regarding overall plan of care and treatment  options, ensuring decisions are within the context of the patients values and GOCs.  Decision Maker: Raeshawn Tafolla (spouse) [phone]  SUMMARY OF RECOMMENDATIONS   DNAR/DNI  Recommended against NGT-G-tube given underlying dementia   Time trial of therapies - if no improvements in the next 48-72 hours proceed with hospice discussions  Offer spiritual support  Code Status/Advance Care Planning: DNAR/DNI   Symptom Management:    Failure to Thrive:  - Dietician involved  - Encourage 1:1 feeding  - Supplemental nutrients  Delirium:                 - Delirium precautions                 - Get up during the day                 - Encourage a familiar face to remain present throughout the day                 - Keep blinds open and lights on during daylight hours                 - Minimize the use of opioids/benzodiazepines    - Utilize music therapy  Palliative Prophylaxis:   Oral care, mobility, pain, delirium  Additional Recommendations (Limitations, Scope, Preferences):  Treat what is treatable   Psycho-social/Spiritual:   Desire for further Chaplaincy support: Yes  Additional Recommendations: Education on hospice   Prognosis: Poor in the setting of post covid pna FTT  Discharge Planning: Unclear  PPS: 10%   This conversation/these recommendations were discussed with patient primary care team, Dr. Cleaster Corin  Time In: 0830 Time Out: 0940 Total Time: 70 Greater than 50%  of this time was spent counseling and coordinating care related to the above assessment and plan.  Lamarr Lulas Burchard Palliative Medicine Team Team Cell Phone: [phone] Please utilize secure chat with additional questions, if there is no response within 30 minutes please call the above phone number  Palliative Medicine Team providers are available by phone from 7am to 7pm daily and can be reached through the team cell phone.  Should this patient require assistance outside of  these hours, please call the patient's attending physician.

## 2019-10-28 DIAGNOSIS — Z66 Do not resuscitate: Secondary | ICD-10-CM

## 2019-10-28 DIAGNOSIS — R627 Adult failure to thrive: Secondary | ICD-10-CM

## 2019-10-28 DIAGNOSIS — Z7189 Other specified counseling: Secondary | ICD-10-CM

## 2019-10-28 DIAGNOSIS — U071 COVID-19: Secondary | ICD-10-CM

## 2019-10-28 DIAGNOSIS — Z515 Encounter for palliative care: Secondary | ICD-10-CM

## 2019-10-28 MED ORDER — HALOPERIDOL LACTATE 5 MG/ML IJ SOLN
1.0000 mg | Freq: Once | INTRAMUSCULAR | Status: AC
  Administered 2019-10-28: 1 mg via INTRAMUSCULAR

## 2019-10-28 MED ORDER — ENSURE ENLIVE PO LIQD
237.0000 mL | Freq: Two times a day (BID) | ORAL | Status: DC
  Administered 2019-10-28: 237 mL via ORAL

## 2019-10-28 MED ORDER — ADULT MULTIVITAMIN W/MINERALS CH
1.0000 | ORAL_TABLET | Freq: Every day | ORAL | Status: DC
  Filled 2019-10-28: qty 1

## 2019-10-28 MED ORDER — HALOPERIDOL LACTATE 5 MG/ML IJ SOLN
1.0000 mg | Freq: Once | INTRAMUSCULAR | Status: DC
  Filled 2019-10-28: qty 1

## 2019-10-28 NOTE — Progress Notes (Signed)
   Subjective:   Food placed in front of her but she does not want to eat anything. She is asking if we can go to her mothers. Much more conversational today.   Objective:  Vital signs in last 24 hours: Vitals:   10/27/19 1430 10/27/19 2000 10/27/19 2021 10/28/19 0642  BP: 112/71 (!) 89/64 (!) 150/81 (!) 125/55  Pulse: 90 95 100   Resp: 20 19 20 18   Temp: 97.6 F (36.4 C) 97.7 F (36.5 C) 97.8 F (36.6 C) (!) 97.4 F (36.3 C)  TempSrc: Axillary Axillary Oral Axillary  SpO2: 98%  100% 98%  Weight:      Height:        Constitution: sitting up in bed, NAD  HENT: [place]/AT Cardio: irregular rate & rhythm, no LE edema  Respiratory: CTA, cough with deep breathing, no w/r/r MSK: moving all extremities Neuro: oriented to self only, tearful but calm Skin: c/d/i   Assessment/Plan:  Active Problems:   COVID-19   Palliative care by specialist   Goals of care, counseling/discussion   DNR (do not resuscitate)  79yo female with PMH of CHF, dementia, persistent atrial fibrillation, hypertension, and COPD presenting from home with failure to thrive in the setting of recent covid-19 pneumonia.   Failure to Thrive Secondary to Covid-19 Pneumonia Dementia with hospital delirium AKI - resolved Seen by SLP yesterday and placed on dysphagia 1 diet. She is still not really eating. Ongoing goals of care discussions with the patient's husband 61yo and her daughter Iantha Fallen. Seen by palliative yesterday, and transitioned to DNR. We are attempting to see if she may want to eat after discontinuing all lines, delirium precautions and providing the best possible environment. If this does not help over the next 24-48 hours, family is considering transitioning the patient to a more hospice approach. She will be transferred off the covid floor today, and I am hoping this might help further.   - discontinue isolation, notes uploaded to photos, first tested positive 9/1 and has been asymptomatic throughout.   - f/u palliative, appreciate ongoing recommendations and discussion with family  - continue to avoid lines, continue delirium precautions - zyprexa 5 mg daily prn, try to redirect if possible - please avoid physical restraints - 1 to 1 sitter ordered but due to shortage have been unable to obtain this.   - PT, OT  - TOC consulted for assistance with SNF   Aspiration Seen by SLP yesterday and placed on dysphagia I.   - NPO - f/u SLP  - am BMP   CHF Persistent Atrial Fibrillation  HTN  Telemetry has been discontinued to try and help with her delirium. Afib without RVR on exam.   - continue routine vital signs to monitor for RVR  - cont lovenox for afib as she is refusing po intake - hold arb   VTE: lovenox IVF: none Diet: dysphagia I Code: full   Dispo: Anticipated discharge pending medical work-up.   Lennard Capek A, DO 10/28/2019, 7:46 AM Pager: [phone] After 5pm on weekdays and 1pm on weekends: On Call Pager: [phone]

## 2019-10-28 NOTE — TOC Progression Note (Signed)
Transition of Care [facility]) - Progression Note    Patient Details  Name: Robin Salazar MRN: [id number] Date of Birth: [birth year]  Transition of Care [facility]) CM/SW Contact  Janae Bridgeman, RN Phone Number: 10/28/2019, 4:55 PM  Clinical Narrative:    Case management spoke with the Palliative care team and the patient's husband and daughter would like to make arrangement to bring the patient home for hospice care.  I called and spoke with Norm Parcel, RNCM with [facility] at [phone] and sent in a referral for hospice care and equipment for the patient.  Norm Parcel is reaching out to the family this evening to make arrangement to have the patient's hospital bed and equipment ordered for the home.  I will follow up with the family and Norm Parcel, [facility] to help coordinate services and PTAR to home tomorrow when the equipment and services are set up for hospice.  Will continue to follow for needs.        Expected Discharge Plan and Services                                                 Social Determinants of Health (SDOH) Interventions    Readmission Risk Interventions No flowsheet data found.

## 2019-10-28 NOTE — Progress Notes (Signed)
Initial Nutrition Assessment  RD working remotely.  DOCUMENTATION CODES:   Not applicable  INTERVENTION:   -Magic cup TID with meals, each supplement provides 290 kcal and 9 grams of protein -Hormel Shake TID with meals, each supplement provides 520 kcals and 22 grams protein -MVI with minerals daily -Ensure Enlive po BID, each supplement provides 350 kcal and 20 grams of protein -Feeding assistance with meals  NUTRITION DIAGNOSIS:   Inadequate oral intake related to lethargy/confusion, poor appetite, acute illness as evidenced by NPO status, per patient/family report.  Progressing; advanced to dysphagia 1 diet with thin liquids on 10/27/19  GOAL:   Patient will meet greater than or equal to 90% of their needs  Unmet  MONITOR:   Diet advancement, PO intake, Supplement acceptance  REASON FOR ASSESSMENT:   Consult Assessment of nutrition requirement/status  ASSESSMENT:   79 yo female admitted with FTT, COVID-19 pneumonia, delerium with hx of dementia, AKI. PMH includes HTN, CHF, COPD, dementia  9/1- COVID-19+ dx, admit to [facility] with pneumonia 9/9- Admitted to [facility] 9/12- s/p BSE- advanced to dysphagia 1 diet with thin liquids  Reviewed I/O's: +1.1 L x 24 hours  Pt unable to provide additional history secondary to dementia.   Per SLP notes, pt is very distractible and eating very little PO's. She consumed only bites and sips on evaluation.   Reviewed wt hx; pt has experienced a 7.2% wt loss over the past 6 months. While this is not significant for time frame, it is concerning given pt's dementia, FTT, and advanced age. Pt is at high risk for malnutrition, however, RD unable to identify at this time.   Palliative care following; not recommending NGT/cortrak placement due to dementia. Plan to observe for the next 48-72 hours; family considering transition to hospice if pt does not improve.  Labs reviewed.   Diet Order:   Diet Order            DIET - DYS 1  Room service appropriate? No; Fluid consistency: Thin  Diet effective now                 EDUCATION NEEDS:   Not appropriate for education at this time  Skin:  Skin Assessment: Reviewed RN Assessment  Last BM:  Unknown  Height:   Ht Readings from Last 1 Encounters:  10/24/19 5\' 2"  (1.575 m)    Weight:   Wt Readings from Last 1 Encounters:  10/27/19 78.3 kg    Ideal Body Weight:  50 kg  BMI:  Body mass index is 31.57 kg/m.  Estimated Nutritional Needs:   Kcal:  1700-1900 kcals  Protein:  85-95 g  Fluid:  >/= 1.7 L    12/27/19, RD, LDN, CDCES Registered Dietitian II Certified Diabetes Care and Education Specialist Please refer to [facility] for RD and/or RD on-call/weekend/after hours pager

## 2019-10-28 NOTE — Progress Notes (Signed)
Daily Progress Note   Patient Name: Robin Salazar       Date: 10/28/2019 DOB: [birth year]  Age: 79 y.o. MRN#: [id number] Attending Physician: Robin Catalina, MD Primary Care Physician: Robin Like, NP Admit Date: 10/24/2019  Reason for Consultation/Follow-up: Establishing goals of care  Subjective: Patient in bed. Not eating or drinking. Crying, does not answer questions only says- "I want to go to my mother's" I called and discussed with Robin Salazar. In experience with previous Covid + patient's with advanced dementia- if they are going to start eating- it is less likely she will do that in a hospital or skilled nursing facility or other setting that is unfamiliar. Also discussed concern that she will continue to decline no matter what interventions are trialed.  Discussed what goal of care would be for keeping patient in hospital or going to SNF. Noted that she is not receiving interventions from hospital other than sticking her with needles to check her labs, SLP eval which is completed and she did not eat for SLP either.  Patient appears scared and stressed.  We discussed possibility of bringing her home with hospice to focus on comfort. If she starts to eat once she is at home and looks to be improving then Hospice can be stopped.  Robin Salazar called her father to discuss and returned my call- they are in agreement to take patient home with Hospice. They would Salazar a hospital bed at home.   Review of Systems  Unable to perform ROS: Dementia    Length of Stay: 4  Current Medications: Scheduled Meds:  . enoxaparin (LOVENOX) injection  1 mg/kg Subcutaneous Q12H  . feeding supplement (ENSURE ENLIVE)  237 mL Oral BID BM  . multivitamin with minerals  1 tablet Oral Daily  . pantoprazole  40 mg Oral  Daily  . umeclidinium-vilanterol  1 puff Inhalation Daily    Continuous Infusions:   PRN Meds: acetaminophen **OR** acetaminophen, albuterol, OLANZapine zydis  Physical Exam Vitals and nursing note reviewed.  Constitutional:      General: She is in acute distress.  Cardiovascular:     Rate and Rhythm: Normal rate.  Pulmonary:     Effort: Pulmonary effort is normal.  Neurological:     Mental Status: She is disoriented.  Psychiatric:     Comments: Anxious  Vital Signs: BP 125/62 (BP Location: Right Arm)   Pulse 68   Temp 98.4 F (36.9 C) (Axillary)   Resp 17   Ht 5\' 2"  (1.575 m)   Wt 78.3 kg   SpO2 97%   BMI 31.57 kg/m  SpO2: SpO2: 97 % O2 Device: O2 Device: Room Air O2 Flow Rate:    Intake/output summary:   Intake/Output Summary (Last 24 hours) at 10/28/2019 1618 Last data filed at 10/28/2019 0900 Gross per 24 hour  Intake 0 ml  Output --  Net 0 ml   LBM: Last BM Date:  (PTA) Baseline Weight: Weight: 77.1 kg Most recent weight: Weight: 78.3 kg       Palliative Assessment/Data: PPS: 20%      Patient Active Problem List   Diagnosis Date Noted  . Palliative care by specialist   . Goals of care, counseling/discussion   . DNR (do not resuscitate)   . COVID-19 10/24/2019  . Epigastric pain 09/28/2017  . Chest pain in adult 11/18/2016  . Chronic diastolic heart failure (HCC) 11/16/2016  . Hyperlipidemia 11/03/2016  . Chronic anticoagulation 09/09/2015  . Hypertensive heart disease with heart failure (HCC) 09/09/2015  . Persistent atrial fibrillation (HCC) 09/09/2015    Palliative Care Assessment & Plan   Patient Profile: Robin Salazar is a 79 yo female withPMH of CHF,dementia,persistent atrial fibrillation, HTN, andCOPDpresenting from home due to not eating or taking medications. She has severe dementia and was unable to provide history and was only oriented to self. History was provided by her daughter. Palliative care was asked to  get involved in the setting of FTT, not eating in setting of Covid positive patient with dementia.  Assessment/Recommendations/Plan  Family requests to take patient home with hospice TOC referral to assist with home with hospice arrangments  Code Status: DNR   Prognosis:  < 6 months due to significant decline in patient with dementia who is recently contracted Covid- she is no longer eating or drinking  Discharge Planning: Home with Hospice  Care plan was discussed with patient's daughter and care team.  Thank you for allowing the Palliative Medicine Team to assist in the care of this patient.       Total Time 62 minutes Prolonged Time Billed  yes      Greater than 50%  of this time was spent counseling and coordinating care related to the above assessment and plan.  70, AGNP-C Palliative Medicine   Please contact Palliative Medicine Team phone at [phone] for questions and concerns.

## 2019-10-28 NOTE — Progress Notes (Signed)
°  Speech Language Pathology Treatment: Dysphagia  Patient Details Name: Robin Salazar MRN: [id number] DOB: October 07, 1940 Today's Date: 10/28/2019 Time: 1350-1402 SLP Time Calculation (min) (ACUTE ONLY): 12 min  Assessment / Plan / Recommendation Clinical Impression  Pt seen during lunch with pureed solids and thin liquids on tray in front of her. Pt was awake, crying out. She was unable to follow commands, and unwilling to accept any PO trials despite encouragement and distraction. Chart review indicates prior to admit, pt tolerated PO diet without obvious dysphagia, however, her intake had declined recently. Will continue current diet (puree/thin), but unable to determine tolerance based on refusal to take PO.    HPI HPI: 79yo female admitted 10/24/19 with dehydration, poor intake, not taking medications. PMH: CHF, severe dementia, AFib, HTN, HLD, COPD. CXR = Similar patchy peripheral predominant airspace disease, compatible with multifocal pneumonia and the patient's reported history of COVID      SLP Plan  Continue with current plan of care       Recommendations  Diet recommendations: Thin liquid;Dysphagia 1 (puree) Liquids provided via: Cup;Straw Medication Administration: Crushed with puree Supervision: Staff to assist with self feeding;Full supervision/cueing for compensatory strategies Compensations: Minimize environmental distractions;Slow rate;Small sips/bites Postural Changes and/or Swallow Maneuvers: Seated upright 90 degrees                Oral Care Recommendations: Oral care BID Follow up Recommendations: 24 hour supervision/assistance;Skilled Nursing facility SLP Visit Diagnosis: Dysphagia, unspecified (R13.10) Plan: Continue with current plan of care       GO              [place] Flight B. Murvin Natal, [facility], CCC-SLP Speech Language Pathologist Office: [phone] Pager: [phone]  Leigh Aurora 10/28/2019, 2:05 PM

## 2019-10-29 ENCOUNTER — Other Ambulatory Visit: Payer: Self-pay

## 2019-10-29 ENCOUNTER — Encounter (HOSPITAL_COMMUNITY): Payer: Self-pay | Admitting: Internal Medicine

## 2019-10-29 LAB — BASIC METABOLIC PANEL
Anion gap: 12 (ref 5–15)
BUN: 13 mg/dL (ref 8–23)
CO2: 23 mmol/L (ref 22–32)
Calcium: 8.5 mg/dL — ABNORMAL LOW (ref 8.9–10.3)
Chloride: 105 mmol/L (ref 98–111)
Creatinine, Ser: 0.82 mg/dL (ref 0.44–1.00)
GFR calc Af Amer: >60 mL/min (ref >60)
GFR calc non Af Amer: >60 mL/min (ref >60)
Glucose, Bld: 74 mg/dL (ref 70–99)
Potassium: 3.6 mmol/L (ref 3.5–5.1)
Sodium: 140 mmol/L (ref 135–145)

## 2019-10-29 LAB — CULTURE, BLOOD (ROUTINE X 2)
Culture: NO GROWTH
Special Requests: ADEQUATE

## 2019-10-29 MED ORDER — INFLUENZA VAC A&B SA ADJ QUAD 0.5 ML IM PRSY: 0.5000 mL | PREFILLED_SYRINGE | INTRAMUSCULAR | Status: DC

## 2019-10-29 NOTE — Progress Notes (Signed)
HD#5 Subjective:  Overnight Events: none  Patient resting comfortably in bed. She was disoriented to time and place, but did not appear to be in any distress. She was still having a difficult time with PO intake but was tolerating a few sips of hot tea. Otherwise, she denies any new complaints.  Objective:  Vital signs in last 24 hours: Vitals:   10/28/19 1320 10/28/19 1900 10/29/19 1622 10/29/19 1855  BP: 125/62  (!) 132/107 106/81  Pulse: 68  80 83  Resp: 17 17 18  (!) 22  Temp: 98.4 F (36.9 C)  98.2 F (36.8 C) (!) 97.5 F (36.4 C)  TempSrc: Axillary  Oral Oral  SpO2: 97% 97% 98% 92%  Weight:      Height:       Supplemental O2: Room Air SpO2: 92 %   Physical Exam:  Physical Exam Constitutional:      General: She is not in acute distress. HENT:     Head: Normocephalic and atraumatic.  Eyes:     Extraocular Movements: Extraocular movements intact.  Cardiovascular:     Pulses: Normal pulses.  Pulmonary:     Effort: Pulmonary effort is normal.  Abdominal:     General: Abdomen is flat.  Musculoskeletal:        General: Normal range of motion.     Cervical back: Normal range of motion.  Skin:    General: Skin is warm and dry.  Neurological:     General: No focal deficit present.     Mental Status: She is alert. She is disoriented.  Psychiatric:        Mood and Affect: Mood normal.     Filed Weights   10/24/19 2038 10/27/19 0534  Weight: 77.1 kg 78.3 kg    No intake or output data in the 24 hours ending 10/29/19 2030 Net IO Since Admission: 733.8 mL [10/29/19 2030]  Pertinent Labs: CBC Latest Ref Rng & Units 10/25/2019 10/24/2019  WBC 4.0 - 10.5 K/uL 5.4 6.1  Hemoglobin 12.0 - 15.0 g/dL 12/24/2019 34.7  Hematocrit 36 - 46 % 38.5 42.2  Platelets 150 - 400 K/uL 281 272    CMP Latest Ref Rng & Units 10/29/2019 10/26/2019 10/25/2019  Glucose 70 - 99 mg/dL 74 12/25/2019) 956(L)  BUN 8 - 23 mg/dL 13 13 13   Creatinine 0.44 - 1.00 mg/dL 875(I 4.33  Sodium 135 -  145 mmol/L 140 137 139  Potassium 3.5 - 5.1 mmol/L 3.6 3.5 3.6  Chloride 98 - 111 mmol/L 105 99 101  CO2 22 - 32 mmol/L 23 25 29   Calcium 8.9 - 10.3 mg/dL 2.95) 1.88) 8.1(L)  Total Protein 6.5 - 8.1 g/dL - 6.2(L) 5.7(L)  Total Bilirubin 0.3 - 1.2 mg/dL - 1.1 1.0  Alkaline Phos 38 - 126 U/L - 46 41  AST 15 - 41 U/L - 21 24  ALT 0 - 44 U/L - 16 16    Pending Labs: none  Imaging: No results found.  Assessment/Plan:   Active Problems:   COVID-19   Palliative care by specialist   Goals of care, counseling/discussion   DNR (do not resuscitate)   COVID-19 virus infection   FTT (failure to thrive) in adult     Patient Summary: 79yo female with PMH of CHF, dementia, persistent atrial fibrillation, hypertension, and COPD presenting from home with failure to thrive in the setting of recent covid-19 pneumonia.      Failure to Thrive Secondary to Covid-19 Pneumonia Dementia  with hospital delirium She continues to have signs of delirium, but is more calm today. She has not tolerated PO intake well, but has had a few sips of tea. After extensive GOC conversations between the patient's family and the treatment team, the decision has been made to discharge the patient home with hospice.   - Patient will be discharged once hospice has set up the home resources - Family has been made aware of the timeline.  - Patient will be discharge with medication to assist in adding comfort and quality of life.    Aspiration  Patient will still require a Dysphagia 1 diet.  CHF HTN AFIB Patient's non essential medications will be discontinued at discharge.    Diet: dysphagia 1 IVF: None,None VTE: Enoxaparin Code: Full PT/OT recs: Home Health, none. TOC recs: none   Dispo: Anticipated discharge to home with hospice today.    Dellia Cloud, D.O. MCIMTP, PGY-2 Date 10/29/2019 Time 8:30 PM Pager: [phone] Please contact the on call pager after 5 pm and on weekends at [phone].

## 2019-10-29 NOTE — Consult Note (Signed)
   [facility] Inpatient Consult   10/29/2019  Kelbie Moro [birth year] [id number]   [facility] [ACO] Patient: Robin Salazar  Chart reviewed and reveals the patient is currently transitioning to Hospice/Palliative Care. Currently, reviewed notes from [facility] for comfort care measure.  Given this choice patient will have full case management services through Hospice and needs will be met at the hospice level of care.Will sign off at transition.  Natividad Brood, RN BSN [facility] Liaison  [phone] business mobile phone Toll free office [phone]  Fax number: [phone] [email or web address]

## 2019-10-29 NOTE — Progress Notes (Signed)
Bed alarm sounding. Pt found on knees on the side of bed. Dr. Minus Liberty notified. Attempting VS currently, but pt is combative and refusing care. Also notified Dr. Minus Liberty that we are still waiting on DNR to be signed.

## 2019-10-29 NOTE — Progress Notes (Signed)
Robin Salazar to be D/C per MD order. Pt escorted via stretcher by PTAR. An After Visit Summary was printed and given to PTAR. Daughter, Misty Stanley, called and updated about pts D/C and transportation.

## 2019-10-29 NOTE — Progress Notes (Signed)
[facility]: [facility]  Spoke to both daughter Misty Stanley and spouse Iantha Fallen. They both confirm interest in hospice care at home. Pt has been approved for hospice services from our medical provider. I have ordered a hospital bed, Over bed table, BSC and wheelchair for delivery today.   Please contact me with any questions. [phone]

## 2019-10-29 NOTE — Progress Notes (Signed)
Patient arrived to 6N12  from 5N via bed. Report received from [place], [place]. Patient asleep. See MAR. See Assessment. Will continue to monitor.

## 2019-10-29 NOTE — Social Work (Addendum)
4:34pm- PTAR called for next available ([address] [place], [place], [address]), pt husband contacted and family aware. DNR to be signed, per [facility] it still has not been, Earna Coder, RN to notify MD team again.  3:57pm- DME delivered, still no d/c order, messaged Dr. Karilyn Cota for order.   3:15pm- DME next to be delivered. MD notified  1:30pm- No DME delivered yet.  12:52pm- No DME delivered yet.  11:21am- messaged MD Dr. Karilyn Cota to request signed DNR when she is able and of plan for today pending delivery of DME.   11:13am- Spoke with hospice liaison Cheri with [facility]. All DME has been ordered, pending delivery pt would be able to be d/c today.  Cheri to let this Clinical research associate know when DME delivered.   Octavio Graves, MSW, LCSW [facility] Clinical Social Work

## 2019-10-29 NOTE — Progress Notes (Signed)
Physical Therapy Treatment Patient Details Name: Robin Salazar MRN: [id number] DOB: Aug 28, 1940 Today's Date: 10/29/2019    History of Present Illness 79 yo female with PMH of CHF, dementia, persistent atrial fibrillation, HTN, and COPD presenting from home due to not eating or taking medications. Tested + for COVID 9/1. Hospitalized at [facility] 9/4-9/7.    PT Comments    Pt began mobility well needing only min A to come to EOB. Pt stood with mod A +2 for safety and seemed like she would be able to walk but became more emotional with exertion and once tearful did not follow instructions as well and could only step to recliner with RW and mod A +2. PT will continue to follow as long as it seems to benefit pt.     Follow Up Recommendations  SNF;Supervision/Assistance - 24 hour     Equipment Recommendations  Other (comment) (TBD)    Recommendations for Other Services       Precautions / Restrictions Precautions Precautions: Fall Restrictions Weight Bearing Restrictions: No    Mobility  Bed Mobility Overal bed mobility: Needs Assistance Bed Mobility: Supine to Sit     Supine to sit: Min assist     General bed mobility comments: min A to initiate mvmt to EOB and to position hips EOB.   Transfers Overall transfer level: Needs assistance Equipment used: Rolling walker (2 wheeled) Transfers: Sit to/from [employer] Sit to Stand: Mod assist;+2 safety/equipment Stand pivot transfers: Mod assist;+2 physical assistance       General transfer comment: pt able to take several small steps towards recliner but then became confused with task and needed 2 person assist to guide her into recliner. Does not follow commands as well when she becomes emotional  Ambulation/Gait             General Gait Details: unable to safely progress   Stairs             Wheelchair Mobility    Modified Rankin (Stroke Patients Only)       Balance Overall  balance assessment: Needs assistance Sitting-balance support: Feet supported;No upper extremity supported Sitting balance-Leahy Scale: Fair     Standing balance support: Bilateral upper extremity supported;During functional activity Standing balance-Leahy Scale: Poor Standing balance comment: reliant on external support                            Cognition Arousal/Alertness: Awake/alert Behavior During Therapy: Agitated;Anxious;Restless (crying) Overall Cognitive Status: No family/caregiver present to determine baseline cognitive functioning                                 General Comments: dementia at baseline, unsure if this is her baseline. Follows simple commands about 50% of time      Exercises      General Comments General comments (skin integrity, edema, etc.): VSS      Pertinent Vitals/Pain Pain Assessment: Faces Faces Pain Scale: Hurts a little bit Pain Location: generalized, unable to state Pain Descriptors / Indicators: Crying;Moaning Pain Intervention(s): Limited activity within patient's tolerance;Monitored during session    Home Living                      Prior Function            PT Goals (current goals can now be found in the care plan  section) Acute Rehab PT Goals Patient Stated Goal: unable to state PT Goal Formulation: Patient unable to participate in goal setting Time For Goal Achievement: 11/09/19 Potential to Achieve Goals: Fair Progress towards PT goals: Progressing toward goals    Frequency    Min 2X/week      PT Plan Current plan remains appropriate    Co-evaluation              AM-PAC PT "6 Clicks" Mobility   Outcome Measure  Help needed turning from your back to your side while in a flat bed without using bedrails?: A Little Help needed moving from lying on your back to sitting on the side of a flat bed without using bedrails?: A Little Help needed moving to and from a bed to a chair  (including a wheelchair)?: A Lot Help needed standing up from a chair using your arms (e.g., wheelchair or bedside chair)?: A Lot Help needed to walk in hospital room?: Total Help needed climbing 3-5 steps with a railing? : Total 6 Click Score: 12    End of Session Equipment Utilized During Treatment: Gait belt Activity Tolerance: Other (comment) (limited by dementia) Patient left: in chair;with call bell/phone within reach;with chair alarm set Nurse Communication: Mobility status PT Visit Diagnosis: Unsteadiness on feet (R26.81);Muscle weakness (generalized) (M62.81);Difficulty in walking, not elsewhere classified (R26.2);Other abnormalities of gait and mobility (R26.89)     Time: 1209-1220 PT Time Calculation (min) (ACUTE ONLY): 11 min  Charges:  $Therapeutic Activity: 8-22 mins                     Lyanne Co, PT  Acute Rehab Services  Pager [phone] Office [phone]    Lawana Chambers Risa Auman 10/29/2019, 3:15 PM

## 2019-12-16 DEATH — deceased
